# Patient Record
Sex: Female | Born: 1985 | Race: White | Hispanic: No | Marital: Married | Smoking: Never smoker
Health system: Southern US, Community
[De-identification: ages and names within clinical notes are randomized; demographics above are authoritative.]

## PROBLEM LIST (undated history)

## (undated) ENCOUNTER — Inpatient Hospital Stay (HOSPITAL_COMMUNITY): Payer: Self-pay

## (undated) DIAGNOSIS — T8859XA Other complications of anesthesia, initial encounter: Secondary | ICD-10-CM

## (undated) DIAGNOSIS — R87619 Unspecified abnormal cytological findings in specimens from cervix uteri: Secondary | ICD-10-CM

## (undated) DIAGNOSIS — T4145XA Adverse effect of unspecified anesthetic, initial encounter: Secondary | ICD-10-CM

## (undated) DIAGNOSIS — B009 Herpesviral infection, unspecified: Secondary | ICD-10-CM

## (undated) DIAGNOSIS — F430 Acute stress reaction: Secondary | ICD-10-CM

## (undated) DIAGNOSIS — J40 Bronchitis, not specified as acute or chronic: Secondary | ICD-10-CM

## (undated) DIAGNOSIS — R002 Palpitations: Secondary | ICD-10-CM

## (undated) DIAGNOSIS — K219 Gastro-esophageal reflux disease without esophagitis: Secondary | ICD-10-CM

## (undated) DIAGNOSIS — F319 Bipolar disorder, unspecified: Secondary | ICD-10-CM

## (undated) HISTORY — DX: Gastro-esophageal reflux disease without esophagitis: K21.9

## (undated) HISTORY — DX: Palpitations: R00.2

## (undated) HISTORY — PX: WISDOM TOOTH EXTRACTION: SHX21

## (undated) HISTORY — PX: KNEE SURGERY: SHX244

## (undated) HISTORY — DX: Unspecified abnormal cytological findings in specimens from cervix uteri: R87.619

## (undated) HISTORY — PX: TONSILLECTOMY: SUR1361

## (undated) HISTORY — DX: Acute stress reaction: F43.0

## (undated) HISTORY — DX: Herpesviral infection, unspecified: B00.9

---
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
## 2004-05-15 ENCOUNTER — Ambulatory Visit: Payer: Self-pay | Admitting: Family Medicine

## 2004-07-07 ENCOUNTER — Emergency Department: Payer: Self-pay | Admitting: Emergency Medicine

## 2004-07-14 ENCOUNTER — Ambulatory Visit: Payer: Self-pay | Admitting: Family Medicine

## 2005-02-19 ENCOUNTER — Ambulatory Visit: Payer: Self-pay | Admitting: Family Medicine

## 2005-04-28 ENCOUNTER — Encounter: Payer: Self-pay | Admitting: Family Medicine

## 2005-04-28 ENCOUNTER — Ambulatory Visit: Payer: Self-pay | Admitting: Family Medicine

## 2005-04-28 ENCOUNTER — Other Ambulatory Visit: Admission: RE | Admit: 2005-04-28 | Discharge: 2005-04-28 | Payer: Self-pay | Admitting: Family Medicine

## 2005-09-09 ENCOUNTER — Ambulatory Visit: Payer: Self-pay | Admitting: Family Medicine

## 2005-12-15 ENCOUNTER — Ambulatory Visit: Payer: Self-pay | Admitting: Family Medicine

## 2006-03-11 ENCOUNTER — Ambulatory Visit: Payer: Self-pay | Admitting: Family Medicine

## 2006-04-15 ENCOUNTER — Ambulatory Visit: Payer: Self-pay | Admitting: Family Medicine

## 2006-05-10 ENCOUNTER — Other Ambulatory Visit: Admission: RE | Admit: 2006-05-10 | Discharge: 2006-05-10 | Payer: Self-pay | Admitting: Family Medicine

## 2006-05-10 ENCOUNTER — Ambulatory Visit: Payer: Self-pay | Admitting: Family Medicine

## 2006-05-10 ENCOUNTER — Encounter: Payer: Self-pay | Admitting: Family Medicine

## 2006-11-22 ENCOUNTER — Telehealth (INDEPENDENT_AMBULATORY_CARE_PROVIDER_SITE_OTHER): Payer: Self-pay | Admitting: *Deleted

## 2006-12-13 ENCOUNTER — Encounter: Payer: Self-pay | Admitting: Family Medicine

## 2006-12-13 DIAGNOSIS — R040 Epistaxis: Secondary | ICD-10-CM | POA: Insufficient documentation

## 2006-12-13 DIAGNOSIS — R002 Palpitations: Secondary | ICD-10-CM | POA: Insufficient documentation

## 2006-12-14 ENCOUNTER — Ambulatory Visit: Payer: Self-pay | Admitting: Family Medicine

## 2006-12-14 DIAGNOSIS — K219 Gastro-esophageal reflux disease without esophagitis: Secondary | ICD-10-CM | POA: Insufficient documentation

## 2007-01-12 ENCOUNTER — Encounter (INDEPENDENT_AMBULATORY_CARE_PROVIDER_SITE_OTHER): Payer: Self-pay | Admitting: *Deleted

## 2007-06-09 ENCOUNTER — Ambulatory Visit: Payer: Self-pay | Admitting: Family Medicine

## 2007-07-12 ENCOUNTER — Encounter (INDEPENDENT_AMBULATORY_CARE_PROVIDER_SITE_OTHER): Payer: Self-pay | Admitting: Internal Medicine

## 2007-07-13 ENCOUNTER — Ambulatory Visit: Payer: Self-pay | Admitting: Family Medicine

## 2007-07-13 DIAGNOSIS — J069 Acute upper respiratory infection, unspecified: Secondary | ICD-10-CM | POA: Insufficient documentation

## 2007-09-23 ENCOUNTER — Encounter: Payer: Self-pay | Admitting: Family Medicine

## 2007-09-23 ENCOUNTER — Ambulatory Visit: Payer: Self-pay | Admitting: Family Medicine

## 2007-09-23 ENCOUNTER — Other Ambulatory Visit: Admission: RE | Admit: 2007-09-23 | Discharge: 2007-09-23 | Payer: Self-pay | Admitting: Family Medicine

## 2007-09-23 LAB — CONVERTED CEMR LAB

## 2007-10-06 DIAGNOSIS — Z8742 Personal history of other diseases of the female genital tract: Secondary | ICD-10-CM | POA: Insufficient documentation

## 2007-10-07 ENCOUNTER — Telehealth: Payer: Self-pay | Admitting: Family Medicine

## 2007-10-11 ENCOUNTER — Encounter: Payer: Self-pay | Admitting: Family Medicine

## 2007-11-29 ENCOUNTER — Ambulatory Visit: Payer: Self-pay | Admitting: Family Medicine

## 2007-11-29 DIAGNOSIS — M542 Cervicalgia: Secondary | ICD-10-CM | POA: Insufficient documentation

## 2007-11-29 DIAGNOSIS — R5383 Other fatigue: Secondary | ICD-10-CM

## 2007-11-29 DIAGNOSIS — R5381 Other malaise: Secondary | ICD-10-CM | POA: Insufficient documentation

## 2007-12-01 ENCOUNTER — Ambulatory Visit: Payer: Self-pay | Admitting: Family Medicine

## 2007-12-05 ENCOUNTER — Encounter: Payer: Self-pay | Admitting: Family Medicine

## 2007-12-05 ENCOUNTER — Telehealth (INDEPENDENT_AMBULATORY_CARE_PROVIDER_SITE_OTHER): Payer: Self-pay | Admitting: *Deleted

## 2008-05-25 ENCOUNTER — Telehealth (INDEPENDENT_AMBULATORY_CARE_PROVIDER_SITE_OTHER): Payer: Self-pay | Admitting: *Deleted

## 2008-06-21 ENCOUNTER — Ambulatory Visit: Payer: Self-pay | Admitting: Family Medicine

## 2008-06-21 DIAGNOSIS — M6580 Other synovitis and tenosynovitis, unspecified site: Secondary | ICD-10-CM | POA: Insufficient documentation

## 2008-07-03 ENCOUNTER — Ambulatory Visit: Payer: Self-pay | Admitting: Family Medicine

## 2008-07-12 ENCOUNTER — Telehealth (INDEPENDENT_AMBULATORY_CARE_PROVIDER_SITE_OTHER): Payer: Self-pay | Admitting: Internal Medicine

## 2008-07-17 ENCOUNTER — Telehealth (INDEPENDENT_AMBULATORY_CARE_PROVIDER_SITE_OTHER): Payer: Self-pay | Admitting: Internal Medicine

## 2008-12-12 ENCOUNTER — Ambulatory Visit: Payer: Self-pay | Admitting: Family Medicine

## 2008-12-14 ENCOUNTER — Ambulatory Visit: Payer: Self-pay | Admitting: Family Medicine

## 2008-12-21 ENCOUNTER — Ambulatory Visit: Payer: Self-pay | Admitting: Family Medicine

## 2008-12-21 ENCOUNTER — Other Ambulatory Visit: Admission: RE | Admit: 2008-12-21 | Discharge: 2008-12-21 | Payer: Self-pay | Admitting: Family Medicine

## 2008-12-21 ENCOUNTER — Encounter: Payer: Self-pay | Admitting: Family Medicine

## 2008-12-28 ENCOUNTER — Encounter (INDEPENDENT_AMBULATORY_CARE_PROVIDER_SITE_OTHER): Payer: Self-pay | Admitting: *Deleted

## 2009-02-05 ENCOUNTER — Ambulatory Visit: Payer: Self-pay | Admitting: Psychiatry

## 2009-02-06 ENCOUNTER — Encounter: Payer: Self-pay | Admitting: Family Medicine

## 2009-02-11 ENCOUNTER — Ambulatory Visit: Payer: Self-pay | Admitting: Psychiatry

## 2009-02-20 ENCOUNTER — Ambulatory Visit: Payer: Self-pay | Admitting: Psychiatry

## 2009-02-27 ENCOUNTER — Ambulatory Visit: Payer: Self-pay | Admitting: Psychiatry

## 2009-03-06 ENCOUNTER — Ambulatory Visit: Payer: Self-pay | Admitting: Psychiatry

## 2009-03-13 ENCOUNTER — Ambulatory Visit: Payer: Self-pay | Admitting: Psychiatry

## 2009-03-20 ENCOUNTER — Ambulatory Visit: Payer: Self-pay | Admitting: Psychiatry

## 2009-03-26 ENCOUNTER — Ambulatory Visit: Payer: Self-pay | Admitting: Psychiatry

## 2009-04-03 ENCOUNTER — Ambulatory Visit: Payer: Self-pay | Admitting: Psychiatry

## 2009-04-15 ENCOUNTER — Ambulatory Visit: Payer: Self-pay | Admitting: Family Medicine

## 2009-04-15 DIAGNOSIS — A6004 Herpesviral vulvovaginitis: Secondary | ICD-10-CM | POA: Insufficient documentation

## 2009-04-23 ENCOUNTER — Telehealth: Payer: Self-pay | Admitting: Family Medicine

## 2009-04-24 ENCOUNTER — Telehealth: Payer: Self-pay | Admitting: Family Medicine

## 2009-05-17 ENCOUNTER — Telehealth: Payer: Self-pay | Admitting: Family Medicine

## 2009-05-17 ENCOUNTER — Ambulatory Visit: Payer: Self-pay | Admitting: Internal Medicine

## 2009-05-20 ENCOUNTER — Telehealth: Payer: Self-pay | Admitting: Family Medicine

## 2009-05-23 ENCOUNTER — Telehealth: Payer: Self-pay | Admitting: Family Medicine

## 2009-06-11 ENCOUNTER — Ambulatory Visit: Payer: Self-pay | Admitting: Psychiatry

## 2009-06-19 ENCOUNTER — Ambulatory Visit: Payer: Self-pay | Admitting: Psychiatry

## 2009-06-26 ENCOUNTER — Ambulatory Visit: Payer: Self-pay | Admitting: Psychiatry

## 2009-07-01 ENCOUNTER — Telehealth: Payer: Self-pay | Admitting: Family Medicine

## 2009-07-03 ENCOUNTER — Ambulatory Visit: Payer: Self-pay | Admitting: Psychiatry

## 2009-07-10 ENCOUNTER — Ambulatory Visit: Payer: Self-pay | Admitting: Psychiatry

## 2009-07-24 ENCOUNTER — Ambulatory Visit: Payer: Self-pay | Admitting: Psychiatry

## 2009-08-07 ENCOUNTER — Ambulatory Visit: Payer: Self-pay | Admitting: Psychiatry

## 2009-08-15 ENCOUNTER — Ambulatory Visit: Payer: Self-pay | Admitting: Psychiatry

## 2009-08-21 ENCOUNTER — Ambulatory Visit: Payer: Self-pay | Admitting: Psychiatry

## 2009-09-04 ENCOUNTER — Ambulatory Visit: Payer: Self-pay | Admitting: Psychiatry

## 2009-09-24 ENCOUNTER — Ambulatory Visit: Payer: Self-pay | Admitting: Psychiatry

## 2009-10-15 ENCOUNTER — Ambulatory Visit: Payer: Self-pay | Admitting: Psychiatry

## 2009-10-22 ENCOUNTER — Ambulatory Visit: Payer: Self-pay | Admitting: Psychiatry

## 2009-11-12 ENCOUNTER — Telehealth: Payer: Self-pay | Admitting: Family Medicine

## 2009-11-13 ENCOUNTER — Ambulatory Visit: Payer: Self-pay | Admitting: Psychiatry

## 2009-11-15 ENCOUNTER — Telehealth: Payer: Self-pay | Admitting: Family Medicine

## 2009-12-11 ENCOUNTER — Ambulatory Visit: Payer: Self-pay | Admitting: Psychiatry

## 2009-12-25 ENCOUNTER — Telehealth (INDEPENDENT_AMBULATORY_CARE_PROVIDER_SITE_OTHER): Payer: Self-pay | Admitting: *Deleted

## 2009-12-25 ENCOUNTER — Ambulatory Visit: Payer: Self-pay | Admitting: Family Medicine

## 2009-12-25 LAB — CONVERTED CEMR LAB
ALT: 19 units/L (ref 0–35)
Alkaline Phosphatase: 79 units/L (ref 39–117)
Basophils Relative: 0.7 % (ref 0.0–3.0)
Bilirubin, Direct: 0.1 mg/dL (ref 0.0–0.3)
CO2: 29 meq/L (ref 19–32)
Eosinophils Relative: 1.7 % (ref 0.0–5.0)
GFR calc non Af Amer: 92.79 mL/min (ref >60)
Glucose, Bld: 86 mg/dL (ref 70–99)
HDL: 54.2 mg/dL (ref >39.00)
Lymphocytes Relative: 49.9 % — ABNORMAL HIGH (ref 12.0–46.0)
MCV: 94.8 fL (ref 78.0–100.0)
Monocytes Absolute: 0.3 10*3/uL (ref 0.1–1.0)
Monocytes Relative: 8.6 % (ref 3.0–12.0)
Neutrophils Relative %: 39.1 % — ABNORMAL LOW (ref 43.0–77.0)
Platelets: 167 10*3/uL (ref 150.0–400.0)
Potassium: 4.4 meq/L (ref 3.5–5.1)
RBC: 4.15 M/uL (ref 3.87–5.11)
Sodium: 138 meq/L (ref 135–145)
Total Bilirubin: 0.6 mg/dL (ref 0.3–1.2)
Total Protein: 6.5 g/dL (ref 6.0–8.3)
WBC: 3.4 10*3/uL — ABNORMAL LOW (ref 4.5–10.5)

## 2010-01-01 ENCOUNTER — Ambulatory Visit: Payer: Self-pay | Admitting: Family Medicine

## 2010-01-01 ENCOUNTER — Other Ambulatory Visit: Admission: RE | Admit: 2010-01-01 | Discharge: 2010-01-01 | Payer: Self-pay | Admitting: Family Medicine

## 2010-01-01 DIAGNOSIS — F319 Bipolar disorder, unspecified: Secondary | ICD-10-CM | POA: Insufficient documentation

## 2010-01-01 DIAGNOSIS — L708 Other acne: Secondary | ICD-10-CM | POA: Insufficient documentation

## 2010-01-01 DIAGNOSIS — D72819 Decreased white blood cell count, unspecified: Secondary | ICD-10-CM | POA: Insufficient documentation

## 2010-01-07 ENCOUNTER — Encounter: Payer: Self-pay | Admitting: Family Medicine

## 2010-01-07 LAB — CONVERTED CEMR LAB: Pap Smear: NEGATIVE

## 2010-02-13 ENCOUNTER — Ambulatory Visit: Payer: Self-pay | Admitting: Psychiatry

## 2010-03-05 ENCOUNTER — Telehealth: Payer: Self-pay | Admitting: Family Medicine

## 2010-03-11 ENCOUNTER — Telehealth: Payer: Self-pay | Admitting: Family Medicine

## 2010-03-12 ENCOUNTER — Telehealth: Payer: Self-pay | Admitting: Family Medicine

## 2010-04-02 ENCOUNTER — Ambulatory Visit: Payer: Self-pay | Admitting: Family Medicine

## 2010-04-02 ENCOUNTER — Telehealth: Payer: Self-pay | Admitting: Family Medicine

## 2010-04-07 LAB — CONVERTED CEMR LAB
Basophils Relative: 0.7 % (ref 0.0–3.0)
Eosinophils Absolute: 0 10*3/uL (ref 0.0–0.7)
HCT: 39.3 % (ref 36.0–46.0)
Hemoglobin: 13.8 g/dL (ref 12.0–15.0)
Lymphocytes Relative: 34.7 % (ref 12.0–46.0)
Lymphs Abs: 1.8 10*3/uL (ref 0.7–4.0)
MCHC: 35.1 g/dL (ref 30.0–36.0)
Monocytes Relative: 8.8 % (ref 3.0–12.0)
Neutro Abs: 2.9 10*3/uL (ref 1.4–7.7)
RBC: 4.01 M/uL (ref 3.87–5.11)
RDW: 12.6 % (ref 11.5–14.6)

## 2010-06-01 LAB — CONVERTED CEMR LAB
Basophils Absolute: 0 10*3/uL (ref 0.0–0.1)
Basophils Relative: 0.6 % (ref 0.0–3.0)
Calcium: 9.4 mg/dL (ref 8.4–10.5)
Chloride: 105 meq/L (ref 96–112)
Creatinine, Ser: 0.7 mg/dL (ref 0.4–1.2)
Eosinophils Absolute: 0 10*3/uL (ref 0.0–0.7)
GFR calc non Af Amer: 110 mL/min
MCHC: 34.5 g/dL (ref 30.0–36.0)
MCV: 93.1 fL (ref 78.0–100.0)
Neutrophils Relative %: 66 % (ref 43.0–77.0)
RBC: 4.28 M/uL (ref 3.87–5.11)
RDW: 10.9 % — ABNORMAL LOW (ref 11.5–14.6)
TSH: 1.17 microintl units/mL (ref 0.35–5.50)

## 2010-06-03 NOTE — Progress Notes (Signed)
Summary: refill request for valtrex  Phone Note Refill Request Call back at [phone] Message from:  Patient  Refills Requested: Medication #1:  VALTREX 500 MG TABS 1 by mouth two times a day for 5 days for outbreak. Phoned request from pt, please send to [facility].  Initial call taken by: Lowella Petties CMA,  July 01, 2009 9:38 AM  Follow-up for Phone Call        px written on EMR for call in  Follow-up by: Judith Part MD,  July 01, 2009 10:28 AM    Prescriptions: VALTREX 500 MG TABS (VALACYCLOVIR HCL) 1 by mouth two times a day for 5 days for outbreak  #10 x 3   Entered by:   Delilah Shan CMA (AAMA)   Authorized by:   Judith Part MD   Signed by:   Delilah Shan CMA (AAMA) on 07/01/2009   Method used:   Electronically to        [facility]* (retail)       [address]       [place], [place]  [address]       Ph: [phone]       Fax: [phone]   RxID:   [id number]

## 2010-06-03 NOTE — Progress Notes (Signed)
Summary: Pt wants rx for retinol cream  Phone Note Call from Patient Call back at [phone]   Caller: Patient Call For: Judith Part MD Summary of Call: Pt had labs today she mentioned she wanted to try the retinol cream for her face. [facility] Initial call taken by: Liane Comber CMA Duncan Dull),  April 02, 2010 8:43 AM  Follow-up for Phone Call        we have disc this for tx of acne follow directions carefully -- do not use if pregnant give it 3 mo to work  use sunscreen px written on EMR for call in  Follow-up by: Judith Part MD,  April 02, 2010 10:42 AM  Additional Follow-up for Phone Call Additional follow up Details #1::        Medication phoned to [facility] pharmacy as instructed. Left message for patient to call back. Lewanda Rife LPN  April 02, 2010 11:58 AM   Advised pt. Additional Follow-up by: Lowella Petties CMA, AAMA,  April 02, 2010 12:17 PM    New/Updated Medications: DIFFERIN 0.1 % CREA (ADAPALENE) apply to aff area as directed once daily for acne Prescriptions: DIFFERIN 0.1 % CREA (ADAPALENE) apply to aff area as directed once daily for acne  #1 medium x 3   Entered and Authorized by:   Judith Part MD   Signed by:   Lewanda Rife LPN on 11/91/4782   Method used:   Telephoned to ...         RxID:   [id number]

## 2010-06-03 NOTE — Assessment & Plan Note (Signed)
Summary: IRRITATED EYE   Vital Signs:  Patient profile:   25 year old female Weight:      125.38 pounds BMI:     21.27 Temp:     98.4 degrees F oral Pulse rate:   80 / minute Pulse rhythm:   regular BP sitting:   100 / 68  (left arm) Cuff size:   regular  Vitals Entered By: Linde Gillis CMA Duncan Dull) (May 17, 2009 5:06 PM) CC: eye pain   History of Present Illness: Left eye has been feeling funny feels like there is a hair in there irritated feeling Feels like eyelids are sticky  Noted some white discharge in the corners of her eye  No foreign body mild "tired" feeling in left eye  Had cold about 3 weeks ago No ongoing problems with this  Allergies: 1)  ! Naprosyn 2)  Duricef 3)  * Depo Provera 4)  * Ortho Tricyclen  Past History:  Past medical, surgical, family and social histories (including risk factors) reviewed for relevance to current acute and chronic problems.  Past Medical History: Reviewed history from 04/15/2009 and no changes required. abnormal pap GERD palpitations  stress reaction  HSV 2   Past Surgical History: Reviewed history from 09/23/2007 and no changes required. Tonsillectomy Knee surgery Holter monitor (2005) Pelvic US- neg (09/2005) wisdom teeth removed   Family History: Reviewed history from 09/23/2007 and no changes required. mother- healthy father- obese, HTN and chol   GF with prostate ca uncle with leukemia HTN on father's side GF with ETOH and MI some high chol   Social History: Reviewed history from 04/15/2009 and no changes required. grad college- psych and human development  non smoker occ alcohol - 2 times a week works at [employer] course finished school may-- prep for grad school--06/2008--now at [employer], ? Haematologist  monogamous boyfriend (has known hsv2)  Review of Systems       no new eye makeup No vision changes No contacts  Physical Exam  General:  alert.   NAD Eyes:  right eye normal  left eye with very mild inflammaiton of tarsal lower lid no foreign body slight injection   Impression & Recommendations:  Problem # 1:  CONJUNCTIVITIS (ICD-372.30) Assessment New  very mild will give rx for drops if worsens  Her updated medication list for this problem includes:    Sulfacetamide Sodium 10 % Soln (Sulfacetamide sodium) .Marland Kitchen... 2 drops in each eye three times a day for 5 days  Complete Medication List: 1)  Yaz 3-0.02 Mg Tabs (Drospirenone-ethinyl estradiol) .... Take by mouth as directed 2)  Multivitamins Tabs (Multiple vitamin) .... Take 1 tablet by mouth once a day 3)  Sulfacetamide Sodium 10 % Soln (Sulfacetamide sodium) .... 2 drops in each eye three times a day for 5 days  Patient Instructions: 1)  Please schedule a follow-up appointment as needed .  Prescriptions: SULFACETAMIDE SODIUM 10 % SOLN (SULFACETAMIDE SODIUM) 2 drops in each eye three times a day for 5 days  #1 bottle x 0   Entered and Authorized by:   Cindee Salt MD   Signed by:   Cindee Salt MD on 05/17/2009   Method used:   Print then Give to Patient   RxID:   [id number]   Current Allergies (reviewed today): ! NAPROSYN DURICEF * DEPO PROVERA * ORTHO TRICYCLEN

## 2010-06-03 NOTE — Progress Notes (Signed)
Summary: ? about Valtrex  Phone Note Call from Patient Call back at [phone]   Caller: Patient Call For: Judith Part MD Summary of Call: Patient called and said that she can't tell that Valtrex has helped a whole lot.  She has been a little nauseated but she was nauseated the last time she took Valtrex.  She doesn't think she is getting sick or catching the stomach bug that has been goin around, but would like a call back.   Initial call taken by: Linde Gillis CMA Duncan Dull),  May 23, 2009 4:01 PM  Follow-up for Phone Call        Spoke with pt, she says her symptoms are worse with valtrex this time.  She is having extreme nausea, but no vomiting.  Having some cramping and turning and a little bit of diarrhea.  No fever.  She thinks she needs a refill on the valtrex, but doesnt know if she should continue to take it.  [facility]  Pt knows that you are out of the office this afternoon. Follow-up by: Lowella Petties CMA,  May 23, 2009 4:11 PM  Additional Follow-up for Phone Call Additional follow up Details #1::        will problably need to substitute different antiviral for next outbreak- and I will note rxn this time  if she took course for this outbreak-- further med will not help  please call next time she has outbreak - or f/u if this one does not improve in a week Additional Follow-up by: Judith Part MD,  May 23, 2009 4:27 PM    Additional Follow-up for Phone Call Additional follow up Details #2::    Advised pt, she agreed.               Lowella Petties CMA  May 23, 2009 4:29 PM

## 2010-06-03 NOTE — Progress Notes (Signed)
Summary: refill request for valtrex 1 gm  Phone Note Refill Request Call back at [phone]   Refills Requested: Medication #1:  VALTREX 1 GM TABS 1 by mouth two times a day for 5 days Please call to [facility].  Initial call taken by: Lowella Petties CMA, AAMA,  March 05, 2010 8:42 AM  Follow-up for Phone Call        px written on EMR for call in  Follow-up by: Judith Part MD,  March 05, 2010 8:45 AM  Additional Follow-up for Phone Call Additional follow up Details #1::        Medication phoned to [facility] pharmacy as instructed. Patient notified as instructed by telephone. Lewanda Rife LPN  March 05, 2010 11:00 AM     New/Updated Medications: VALTREX 1 GM TABS (VALACYCLOVIR HCL) 1 by mouth two times a day for 5 days as needed Prescriptions: VALTREX 1 GM TABS (VALACYCLOVIR HCL) 1 by mouth two times a day for 5 days as needed  #10 x 5   Entered and Authorized by:   Judith Part MD   Signed by:   Lewanda Rife LPN on 45/40/9811   Method used:   Telephoned to ...         RxID:   [id number]

## 2010-06-03 NOTE — Progress Notes (Signed)
Summary: valtrex is not helping  Phone Note Call from Patient Call back at [phone]   Caller: Patient Summary of Call: Pt has been taking valtrex for herpes outbreak, she has a day and a half left. She says this is not helping her this time and she is asking if there is something else she can try.  [facility] Initial call taken by: Lowella Petties CMA,  November 15, 2009 10:09 AM  Follow-up for Phone Call        increase the valtrex to 1 gram two times a day for 5 days then stay on 500 mg dose every day to prevent outbreak  if not improved - please fu- or if fever or any other symptoms px written on EMR for call in  Follow-up by: Judith Part MD,  November 15, 2009 10:18 AM  Additional Follow-up for Phone Call Additional follow up Details #1::        Left message for patient to call back. Medication phoned to [facility] pharmacy as instructed. Lewanda Rife LPN  November 15, 2009 11:15 AM   Patient notified as instructed by telephone. Lewanda Rife LPN  November 15, 2009 2:46 PM     New/Updated Medications: VALTREX 500 MG TABS (VALACYCLOVIR HCL) 1 by mouth once daily (after finishing the 5 day higher dose course) VALTREX 1 GM TABS (VALACYCLOVIR HCL) 1 by mouth two times a day for 5 days Prescriptions: VALTREX 1 GM TABS (VALACYCLOVIR HCL) 1 by mouth two times a day for 5 days  #10 x 0   Entered and Authorized by:   Judith Part MD   Signed by:   Lewanda Rife LPN on 45/40/9811   Method used:   Telephoned to ...         RxID:   [id number] VALTREX 500 MG TABS (VALACYCLOVIR HCL) 1 by mouth once daily (after finishing the 5 day higher dose course)  #30 x 11   Entered and Authorized by:   Judith Part MD   Signed by:   Lewanda Rife LPN on 78/46/9629   Method used:   Telephoned to ...         RxID:   [id number]

## 2010-06-03 NOTE — Progress Notes (Signed)
Summary: yeast infection is not any better  Phone Note Call from Patient Call back at [phone]   Caller: Patient Call For: Judith Part MD Summary of Call: Pt was given diflucan yesterday for a yeast infection.  She says this is no better and is asking if she should just give it more time to work.  [facility] Initial call taken by: Lowella Petties CMA, AAMA,  March 12, 2010 10:31 AM  Follow-up for Phone Call        if it did not work-- then it could be another type of vaginitis that is not yeast- so need to check that out need appt please  Follow-up by: Judith Part MD,  March 12, 2010 11:25 AM  Additional Follow-up for Phone Call Additional follow up Details #1::        Left message for patient to call back. Lewanda Rife LPN  March 12, 2010 4:20 PM   Patient notified as instructed by telephone. Pt said she is feeling better this afternoon and would like to wait to see if she called to early. If pt has problem tomorrow she will call back for appt.Lewanda Rife LPN  March 12, 2010 4:30 PM

## 2010-06-03 NOTE — Assessment & Plan Note (Signed)
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  [employer] just started grad school    wt is stable   bp great 106/68  hx of abn pap and hsv in past last pap nl 8/10 is currently sexually active - using condoms    menses- no problems  she stopped taking yaz and is doing fine - peroids are still regular does not need birth control   doing well with lamictal  taking it for mood disorder - possibly early / very slt bipolar// dysthymia   is good with exercise and diet -- is doing well with that     Tdap 09 up to date   labs -- lipids good with HDL 54 and LDL 83  wbc is 3.4 does have hx of leukemia in family  a lot of acne off OC  has used neutrogena/ aaveno / stridex pads proactive did not work  oily complexion    Allergies: 1)  ! 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 Eyes:  Denies blurring and eye irritation. CV:  Denies chest pain or discomfort, lightheadness, and palpitations. Resp:  Denies cough, shortness of breath, and wheezing. GI:  Denies abdominal pain, change in bowel habits, indigestion, and nausea. GU:  Denies abnormal vaginal bleeding, discharge, dysuria, genital sores, and urinary frequency. MS:  Denies joint pain, joint redness, and joint swelling. Derm:  Denies itching, lesion(s), poor wound healing, and rash. Neuro:  Denies numbness, tingling, and weakness. Psych:  Denies panic attacks. Endo:  Denies cold intolerance, excessive thirst, excessive urination, and heat intolerance. Heme:  Denies abnormal bruising and bleeding.  Physical Exam  General:  Well-developed,well-nourished,in no acute distress; alert,appropriate and cooperative throughout examination Head:  normocephalic, atraumatic, and no abnormalities observed.   Eyes:  vision grossly intact, pupils equal, pupils round, and pupils reactive to light.  no conjunctival pallor, injection or icterus  Ears:  R ear normal and L ear normal.   Nose:  no nasal discharge.   Mouth:  pharynx pink and moist.   Neck:  supple with full rom and no masses or thyromegally, no JVD or carotid bruit  Chest Wall:  No deformities, masses, or tenderness noted. Breasts:  No mass, nodules, thickening, tenderness, bulging, retraction, inflamation, nipple discharge or skin changes noted.   Lungs:  Normal respiratory effort, chest expands symmetrically. Lungs are clear to auscultation, no crackles or wheezes. Heart:  Normal rate  and regular rhythm. S1 and S2 normal without gallop, murmur, click, rub or other extra sounds. Abdomen:  Bowel sounds positive,abdomen soft and non-tender without masses, organomegaly or hernias noted. Genitalia:  Normal introitus for age, no external lesions, no vaginal discharge, mucosa pink and moist, no vaginal or cervical lesions, no vaginal atrophy, no friaility or hemorrhage, normal uterus size and position, no adnexal masses or tenderness small amt of blood at os Msk:  No deformity or scoliosis noted of thoracic or lumbar spine.  no acute joint changes  Pulses:  R and L carotid,radial,femoral,dorsalis pedis and posterior tibial pulses are full and equal bilaterally Extremities:  No clubbing, cyanosis, edema, or deformity noted with normal full range of motion of all joints.   Neurologic:  sensation intact to light touch, gait normal, and DTRs symmetrical and normal.   Skin:  Intact without suspicious lesions or rashes notable comedogenic acne on face - forehead and cheeks generally oily complexion Cervical Nodes:  No lymphadenopathy noted Axillary Nodes:  No palpable lymphadenopathy Inguinal Nodes:  No significant adenopathy Psych:  normal affect, talkative and pleasant    Impression & Recommendations:  Problem # 1:  HEALTH MAINTENANCE EXAM (ICD-V70.0) Assessment Comment Only reviewed health habits including diet, exercise and skin cancer prevention reviewed health maintenance list and family history rev labs in detail  good health habits   Problem # 2:  ROUTINE GYNECOLOGICAL EXAMINATION (ICD-V72.31) Assessment: Comment Only annual exam with pap  disc use of condoms plus spermicide for preg prev (not candidate for OC due to lamictal)  disc STD prev as well  Problem # 3:  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) Assessment: New per pt mild with fam hx as well  sees psychiatry and doing well with lamictal  Problem # 4:  LEUKOPENIA, MILD (ICD-288.50) Assessment: New this is  mild and most likely assoc with lamictal  no sympotms remote fam hx of leukemia will follow- re check cbc with diff in 3 mo   Problem # 5:  ACNE VULGARIS, FACIAL (ICD-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 Apply to acne prone areas once daily two times a day as directed in thin film  Complete Medication List: 1)  Multivitamins Tabs (Multiple vitamin) .... Take 1 tablet by mouth once a day 2)  Valtrex 500 Mg Tabs (Valacyclovir hcl) .Marland Kitchen.. 1 by mouth once daily (after finishing the 5 day higher dose course) 3)  Valtrex 1 Gm Tabs (Valacyclovir hcl) .Marland Kitchen.. 1 by mouth two times a day for 5 days 4)  Ginkoba 40 Mg Tabs (Ginkgo biloba) .... Take 1 tablet by mouth  once a day 5)  Lamictal 150 Mg Tabs (Lamotrigine) .... Take 1 tablet by mouth once a day 6)  Benzaclin 1-5 % Gel (Clindamycin phos-benzoyl perox) .... Apply to acne prone areas once daily two times a day as directed in thin film  Patient Instructions: 1)  schedule non fasting labs in 3 months for cbc with diff for leukopenia  2)  keep up the good diet and exercise 3)  try the benzaclin gel for acne as directed  4)  use gentle cleanser two times a day  Prescriptions: BENZACLIN 1-5 % GEL (CLINDAMYCIN PHOS-BENZOYL PEROX) apply to acne prone areas once daily two times a day as directed in thin film  #1 large x 3   Entered and Authorized by:   Judith Part MD   Signed by:   Judith Part MD on 01/01/2010   Method used:   Electronically to        [facility]* (retail)       [address]       [place], [place]  [address]       Ph: [phone]       Fax: [phone]   RxID:   [id number]   Current  Allergies (reviewed today): ! NAPROSYN DURICEF * DEPO PROVERA * ORTHO TRICYCLEN

## 2010-06-03 NOTE — Progress Notes (Signed)
----   Converted from flag ---- ---- 12/24/2009 8:08 PM, Judith Part MD wrote: please check wellness and lipids v70.0-thanks   ---- 12/24/2009 8:27 AM, Liane Comber CMA (AAMA) wrote: Peri [place] Morning! This pt is scheduled for cpx labs tomorrow, which labs to draw and dx codes to use? Thanks Tasha ------------------------------

## 2010-06-03 NOTE — Progress Notes (Signed)
Summary: lt eye irritated, itching and has white drainage  Phone Note Call from Patient Call back at [phone]   Caller: Patient Call For: Judith Part MD Summary of Call: For a couple of days pt's left eye has felt irritated, itching and has white drainage from lt eye. No redness in left eye. Pt said would come in and see Dr. Milinda Antis if necessary or can call med to [facility] [phone]. Please advise.  Pt can be reached at [phone]. Initial call taken by: Lewanda Rife LPN,  May 17, 2009 11:10 AM  Follow-up for Phone Call        cannot tx her without visit  if no appts avail- sat clinic please  Follow-up by: Judith Part MD,  May 17, 2009 11:35 AM  Additional Follow-up for Phone Call Additional follow up Details #1::        Appt made. Additional Follow-up by: Lowella Petties CMA,  May 17, 2009 1:25 PM

## 2010-06-03 NOTE — Letter (Signed)
Summary: Results Follow up Letter  [place] [facility]  [address] [place], [place] [address]   Phone: [phone]  Fax: [phone]    01/07/2010 MRN: [id number]    [facility] [address] [place], [place]  [address]    Dear Ms. Alonza Smoker,  The following are the results of your recent test(s):  Test         Result    Pap Smear:        Normal ___X__  Not Normal _____ Comments: ______________________________________________________ Cholesterol: LDL(Bad cholesterol):         Your goal is less than:         HDL (Good cholesterol):       Your goal is more than: Comments:  ______________________________________________________ Mammogram:        Normal _____  Not Normal _____ Comments:  ___________________________________________________________________ Hemoccult:        Normal _____  Not normal _______ Comments:    _____________________________________________________________________ Other Tests:    We routinely do not discuss normal results over the telephone.  If you desire a copy of the results, or you have any questions about this information we can discuss them at your next office visit.   Sincerely,    Idamae Schuller Tower,MD  MT/ri

## 2010-06-03 NOTE — Progress Notes (Signed)
Summary: requests diflucan  Phone Note Call from Patient Call back at [phone]   Caller: Patient Call For: Judith Part MD Summary of Call: Pt is asking for diflucan, states she has a yeast infection, which she often gets after outbreaks.  [facility] Initial call taken by: Lowella Petties CMA, AAMA,  March 11, 2010 10:05 AM  Follow-up for Phone Call        px written on EMR for call in f/u if not improved  Follow-up by: Judith Part MD,  March 11, 2010 10:59 AM  Additional Follow-up for Phone Call Additional follow up Details #1::        Rx sent to pharmacy. Patient notified by telephone. Additional Follow-up by: Sydell Axon LPN,  March 11, 2010 1:36 PM    New/Updated Medications: DIFLUCAN 150 MG TABS (FLUCONAZOLE) 1 by mouth times one for yeast infx Prescriptions: DIFLUCAN 150 MG TABS (FLUCONAZOLE) 1 by mouth times one for yeast infx  #1 x 0   Entered by:   Sydell Axon LPN   Authorized by:   Judith Part MD   Signed by:   Sydell Axon LPN on 82/95/6213   Method used:   Electronically to        [facility]* (retail)       [address]       [place], [place]  [address]       Ph: [phone]       Fax: [phone]   RxID:   [id number]

## 2010-06-03 NOTE — Progress Notes (Signed)
Summary: Valtrex refill  Phone Note Call from Patient Call back at [phone]   Caller: Patient Call For: Judith Part MD Summary of Call: Pt needs refill on Valtrex. Pt said new outbreak on perineal area this weekend. Pt uses [facility] [phone].Please advise.  Initial call taken by: Lewanda Rife LPN,  May 20, 2009 10:02 AM  Follow-up for Phone Call        px written on EMR for call in treatment for recurrent episodes is 500 mg two times a day for 5 days let me know if not improving   Follow-up by: Judith Part MD,  May 20, 2009 10:41 AM  Additional Follow-up for Phone Call Additional follow up Details #1::        Called to [facility], advised pt. Additional Follow-up by: Lowella Petties CMA,  May 20, 2009 10:46 AM    New/Updated Medications: VALTREX 500 MG TABS (VALACYCLOVIR HCL) 1 by mouth two times a day for 5 days for outbreak Prescriptions: VALTREX 500 MG TABS (VALACYCLOVIR HCL) 1 by mouth two times a day for 5 days for outbreak  #10 x 3   Entered and Authorized by:   Judith Part MD   Signed by:   Judith Part MD on 05/20/2009   Method used:   Telephoned to ...         RxID:   [id number]

## 2010-06-03 NOTE — Progress Notes (Signed)
Summary: valtrex  Phone Note Refill Request Call back at [phone] Message from:  Patient on November 12, 2009 11:53 AM  Refills Requested: Medication #1:  VALTREX 500 MG TABS 1 by mouth two times a day for 5 days for outbreak. [facility]  Initial call taken by: Melody Comas,  November 12, 2009 11:53 AM  Follow-up for Phone Call        px written on EMR for call in  Follow-up by: Judith Part MD,  November 12, 2009 12:06 PM  Additional Follow-up for Phone Call Additional follow up Details #1::        Patient notified as instructed by telephone. Medication phoned to [facility] pharmacy as instructed. Lewanda Rife LPN  November 12, 2009 12:45 PM     New/Updated Medications: VALTREX 500 MG TABS (VALACYCLOVIR HCL) 1 by mouth two times a day for 5 days for outbreak Prescriptions: VALTREX 500 MG TABS (VALACYCLOVIR HCL) 1 by mouth two times a day for 5 days for outbreak  #10 x 3   Entered and Authorized by:   Judith Part MD   Signed by:   Lewanda Rife LPN on 11/91/4782   Method used:   Telephoned to ...         RxID:   [id number]

## 2010-11-12 ENCOUNTER — Other Ambulatory Visit: Payer: Self-pay | Admitting: *Deleted

## 2010-11-12 MED ORDER — VALACYCLOVIR HCL 500 MG PO TABS: ORAL_TABLET | ORAL | Status: DC

## 2010-11-12 NOTE — Telephone Encounter (Signed)
Sent electronically 

## 2011-01-07 ENCOUNTER — Encounter: Payer: Self-pay | Admitting: Family Medicine

## 2011-01-07 ENCOUNTER — Ambulatory Visit (INDEPENDENT_AMBULATORY_CARE_PROVIDER_SITE_OTHER): Payer: BC Managed Care – PPO | Admitting: Family Medicine

## 2011-01-07 DIAGNOSIS — Z113 Encounter for screening for infections with a predominantly sexual mode of transmission: Secondary | ICD-10-CM | POA: Insufficient documentation

## 2011-01-07 DIAGNOSIS — L708 Other acne: Secondary | ICD-10-CM

## 2011-01-07 DIAGNOSIS — Z0000 Encounter for general adult medical examination without abnormal findings: Secondary | ICD-10-CM

## 2011-01-07 DIAGNOSIS — A6004 Herpesviral vulvovaginitis: Secondary | ICD-10-CM

## 2011-01-07 LAB — COMPREHENSIVE METABOLIC PANEL
CO2: 29 mEq/L (ref 19–32)
Calcium: 9.6 mg/dL (ref 8.4–10.5)
Chloride: 103 mEq/L (ref 96–112)
Creatinine, Ser: 0.8 mg/dL (ref 0.4–1.2)
GFR: 94.77 mL/min (ref >60.00)
Glucose, Bld: 71 mg/dL (ref 70–99)
Total Bilirubin: 0.5 mg/dL (ref 0.3–1.2)

## 2011-01-07 LAB — CBC WITH DIFFERENTIAL/PLATELET
Basophils Relative: 0.5 % (ref 0.0–3.0)
Eosinophils Relative: 0.4 % (ref 0.0–5.0)
HCT: 40 % (ref 36.0–46.0)
Hemoglobin: 13.6 g/dL (ref 12.0–15.0)
Lymphs Abs: 1.4 10*3/uL (ref 0.7–4.0)
Monocytes Relative: 7.7 % (ref 3.0–12.0)
Neutro Abs: 2.8 10*3/uL (ref 1.4–7.7)
RBC: 4.09 Mil/uL (ref 3.87–5.11)
WBC: 4.6 10*3/uL (ref 4.5–10.5)

## 2011-01-07 LAB — HM PAP SMEAR

## 2011-01-07 LAB — TSH: TSH: 0.29 u[IU]/mL — ABNORMAL LOW (ref 0.35–5.50)

## 2011-01-07 LAB — LIPID PANEL
HDL: 71 mg/dL (ref >39.00)
VLDL: 12.4 mg/dL (ref 0.0–40.0)

## 2011-01-07 MED ORDER — DROSPIRENONE-ETHINYL ESTRADIOL 3-0.02 MG PO TABS: 1.0000 | ORAL_TABLET | Freq: Every day | ORAL | Status: DC

## 2011-01-07 NOTE — Assessment & Plan Note (Signed)
Doing well with suppressive therapy  Will continue this  Checking other std tests today F/u for gyn exam

## 2011-01-07 NOTE — Progress Notes (Signed)
Subjective:    Patient ID: Kayla Chavez, female    DOB: 1985-12-09, 25 y.o.   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 HERPES VULVOVAGINITIS  . LEUKOPENIA, MILD  . BIPOLAR AFFECTIVE DISORDER  . G E R D  . ACNE VULGARIS, FACIAL  . FATIGUE  . EPISTAXIS  . ASCUS PAP  . Routine general medical examination at a health care facility  . Screen for STD (sexually transmitted disease)   Past Medical History  Diagnosis Date  . Abnormal Pap smear of cervix   . GERD (gastroesophageal reflux disease)   . Palpitations   . Stress reaction   . HSV-2 infection    Past Surgical History  Procedure Date  . Tonsillectomy   . Knee surgery   . Wisdom tooth extraction    History  Substance Use Topics  . Smoking status: Never Smoker   . Smokeless tobacco: Not on file  . Alcohol Use: Yes   Family History  Problem Relation Age of Onset  . Hypertension Father   . Hyperlipidemia Father   . Leukemia Paternal Uncle   . Cancer Paternal Grandfather     prostate  . Alcohol abuse Paternal Grandfather   . Hyperlipidemia Paternal Grandfather    Allergies  Allergen Reactions  . Cefadroxil     REACTION: rash  . Depo-Provera     GI   . Naproxen    REACTION: diarrhea  . Ortho Tri-Cyclen (Norgestim-Eth Estrad Triphasic)     Heavy menses   Current Outpatient Prescriptions on File Prior to Visit  Medication Sig Dispense Refill  . valACYclovir (VALTREX) 500 MG tablet Take one by mouth once daily  30 tablet  11      Review of Systems Review of Systems  Constitutional: Negative for fever, appetite change, fatigue and unexpected weight change.  Eyes: Negative for pain and visual disturbance.  Respiratory: Negative for cough and shortness of breath.   Cardiovascular: Negative for cp or palpitations    Gastrointestinal: Negative for nausea, diarrhea and constipation.  Genitourinary: Negative for urgency and frequency.  Skin: Negative for pallor or rash  pos for acne Neurological: Negative for weakness, light-headedness, numbness and headaches.  Hematological: Negative for adenopathy. Does not bruise/bleed easily.  Psychiatric/Behavioral: Negative for dysphoric mood. The patient is not nervous/anxious.          Objective:   Physical Exam  Constitutional: She appears well-developed and well-nourished. No distress.  HENT:  Head: Normocephalic and atraumatic.  Right Ear: External ear normal.  Left Ear: External ear normal.  Nose: Nose  normal.  Mouth/Throat: Oropharynx is clear and moist.  Eyes: Conjunctivae and EOM are normal. Pupils are equal, round, and reactive to light.  Neck: Normal range of motion. Neck supple. No JVD present. Carotid bruit is not present. No thyromegaly present.  Cardiovascular: Normal rate, regular rhythm, normal heart sounds and intact distal pulses.   Pulmonary/Chest: Effort normal and breath sounds normal. No respiratory distress. She has no wheezes.  Abdominal: Soft. Bowel sounds are normal. She exhibits no distension and no mass. There is no tenderness.  Musculoskeletal: Normal range of motion. She exhibits no edema and no tenderness.  Lymphadenopathy:    She has no cervical adenopathy.  Neurological:  She is alert. She has normal reflexes. No cranial nerve deficit. Coordination normal.  Skin: Skin is warm and dry. No rash noted. No erythema. No pallor.       comedonal acne over chin and jaw line  No pustules   Psychiatric: She has a normal mood and affect.          Assessment & Plan:

## 2011-01-07 NOTE — Patient Instructions (Addendum)
Please schedule visit for gyn exam -any 15 min visit is fine Labs today  Will discuss at your follow up  Will start back on yaz for acne- and update me if any problems

## 2011-01-07 NOTE — Assessment & Plan Note (Signed)
Pt requested Does use condoms  Will check hiv and also rpr with blood test Then will check gon/ chlam with pap when she returns No sympotms at all

## 2011-01-07 NOTE — Assessment & Plan Note (Signed)
Pt failed benzaclin and differin is not working well  Will go back on yaz which is working well - disc pros/ cons If this works very well she will likely go off the differin Will disc further at f/u

## 2011-01-07 NOTE — Assessment & Plan Note (Signed)
Reviewed health habits including diet and exercise and skin cancer prevention Also reviewed health mt list, fam hx and immunizations  Wellness labs today Return for gyn exam when menses is over

## 2011-01-08 LAB — RPR

## 2011-01-22 ENCOUNTER — Encounter: Payer: Self-pay | Admitting: Family Medicine

## 2011-01-23 ENCOUNTER — Ambulatory Visit (INDEPENDENT_AMBULATORY_CARE_PROVIDER_SITE_OTHER): Payer: BC Managed Care – PPO | Admitting: Family Medicine

## 2011-01-23 ENCOUNTER — Other Ambulatory Visit (HOSPITAL_COMMUNITY)
Admission: RE | Admit: 2011-01-23 | Discharge: 2011-01-23 | Disposition: A | Discharge status: Home / Self Care | Payer: BC Managed Care – PPO | Source: Ambulatory Visit | Attending: Family Medicine | Admitting: Family Medicine

## 2011-01-23 ENCOUNTER — Encounter: Payer: Self-pay | Admitting: Family Medicine

## 2011-01-23 VITALS — BP 100/66 | HR 84 | Temp 98.8°F | Ht 64.5 in | Wt 129.5 lb

## 2011-01-23 DIAGNOSIS — Z23 Encounter for immunization: Secondary | ICD-10-CM

## 2011-01-23 DIAGNOSIS — Z01419 Encounter for gynecological examination (general) (routine) without abnormal findings: Secondary | ICD-10-CM | POA: Insufficient documentation

## 2011-01-23 DIAGNOSIS — Z113 Encounter for screening for infections with a predominantly sexual mode of transmission: Secondary | ICD-10-CM

## 2011-01-23 DIAGNOSIS — R7989 Other specified abnormal findings of blood chemistry: Secondary | ICD-10-CM | POA: Insufficient documentation

## 2011-01-23 DIAGNOSIS — R6889 Other general symptoms and signs: Secondary | ICD-10-CM

## 2011-01-23 NOTE — Progress Notes (Signed)
Subjective:    Patient ID: Kayla Chavez, female    DOB: 1985-07-24, 25 y.o.   MRN: [id number]  HPI Here for gyn exam annual   Has hsv on suppressive tx  Doing well with that   On yaz Periods are very regular and , last 4-5 days , not painful -- vary in heaviness Wants to stay on this  Does not smoke  occ pelvic pain before period - ? Cramps   Wanted to do gon and chlam test today STD blood tests were all neg   tsh low - mildly  Pt is a little on the shaky side Has lost wt - but eating differently too  No goiter   Patient Active Problem List  Diagnoses  . HERPES VULVOVAGINITIS  . LEUKOPENIA, MILD  . BIPOLAR AFFECTIVE DISORDER  . G E R D  . ACNE VULGARIS, FACIAL  . FATIGUE  . EPISTAXIS  . ASCUS PAP  . Routine general medical examination at a health care facility  . Screen for STD (sexually transmitted disease)  . Gynecological examination  . Abnormal TSH   Past Medical History  Diagnosis Date  . Abnormal Pap smear of cervix   . GERD (gastroesophageal reflux disease)   . Palpitations   . Stress reaction   . HSV-2 infection    Past Surgical History  Procedure Date  . Tonsillectomy   . Knee surgery   . Wisdom tooth extraction    History  Substance Use Topics  . Smoking status: Never Smoker   . Smokeless tobacco: Not on file  . Alcohol Use: Yes   Family History  Problem Relation Age of Onset  . Hypertension Father   . Hyperlipidemia Father   . Leukemia Paternal Uncle   . Cancer Paternal Grandfather     prostate  . Alcohol abuse Paternal Grandfather   . Hyperlipidemia Paternal Grandfather    Allergies  Allergen Reactions  . Cefadroxil     REACTION: rash  . Depo-Provera     GI   . Naproxen     REACTION: diarrhea  . Ortho Tri-Cyclen (Norgestim-Eth Estrad Triphasic)     Heavy menses   Current Outpatient Prescriptions on File Prior to Visit  Medication Sig Dispense Refill  . adapalene (DIFFERIN) 0.1 % cream Apply to affected area as directed  once daily for acne       . drospirenone-ethinyl estradiol (YAZ) 3-0.02 MG tablet Take 1 tablet by mouth daily.  3 Package  3  . lamoTRIgine (LAMICTAL) 150 MG tablet Take 150 mg by mouth daily.        . Multiple Vitamin (MULTIVITAMIN) tablet Take 1 tablet by mouth daily.        . valACYclovir (VALTREX) 500 MG tablet Take one by mouth once daily  30 tablet  11  . Ginkgo Biloba (GINKOBA) 40 MG TABS Take 1 tablet by mouth daily.        . valACYclovir (VALTREX) 1000 MG tablet Take 1,000 mg by mouth 2 (two) times daily. For 5 days as needed.             Review of Systems Review of Systems  Constitutional: Negative for fever, appetite change, fatigue and unexpected weight change.  Eyes: Negative for pain and visual disturbance.  Respiratory: Negative for cough and shortness of breath.   Cardiovascular: Negative for cp or palpitations    Gastrointestinal: Negative for nausea, diarrhea and constipation.  Genitourinary: Negative for urgency and frequency.  Skin: Negative for pallor  or rash   Neurological: Negative for weakness, light-headedness, numbness and headaches. pos for jitteriness Hematological: Negative for adenopathy. Does not bruise/bleed easily.  Psychiatric/Behavioral: Negative for dysphoric mood. The patient is not nervous/anxious.          Objective:   Physical Exam  Constitutional: She appears well-developed and well-nourished. No distress.  HENT:  Head: Normocephalic and atraumatic.  Mouth/Throat: Oropharynx is clear and moist.  Eyes: Conjunctivae and EOM are normal. Pupils are equal, round, and reactive to light.  Neck: Normal range of motion. Neck supple. No JVD present. No thyromegaly present.       No thyroid goiter  Cardiovascular: Normal rate, regular rhythm, normal heart sounds and intact distal pulses.   Pulmonary/Chest: Effort normal and breath sounds normal. No respiratory distress. She has no wheezes.  Abdominal: Soft. Bowel sounds are normal.  Genitourinary:  Vagina normal and uterus normal. No breast swelling, tenderness, discharge or bleeding. No vaginal discharge found.  Musculoskeletal: She exhibits no edema and no tenderness.  Lymphadenopathy:    She has no cervical adenopathy.  Neurological: She is alert. She has normal reflexes. No cranial nerve deficit. Coordination normal.       Very slight hand tremor   Skin: Skin is warm and dry. No rash noted. No erythema. No pallor.  Psychiatric: She has a normal mood and affect.          Assessment & Plan:

## 2011-01-23 NOTE — Assessment & Plan Note (Signed)
Nl exam Continues yaz- doing well Std screen Rev prev labs

## 2011-01-23 NOTE — Patient Instructions (Signed)
Pap today- will update you with results  Doing thyroid tests today and will update you

## 2011-01-23 NOTE — Assessment & Plan Note (Signed)
Blood tests neg Gon and chlam with pap hsv is stable

## 2011-01-23 NOTE — Assessment & Plan Note (Signed)
Some jitteriness and intentional wt loss Check thyroid prof and update Nl exam

## 2011-01-24 LAB — T3 UPTAKE: T3 Uptake: 24.4 % (ref 22.5–37.0)

## 2011-01-29 ENCOUNTER — Other Ambulatory Visit: Payer: Self-pay

## 2011-01-30 MED ORDER — FLUCONAZOLE 150 MG PO TABS: 150.0000 mg | ORAL_TABLET | Freq: Once | ORAL | Status: DC

## 2011-01-30 NOTE — Telephone Encounter (Signed)
Medication Diflucan 150 mg phoned to [facility] pharmacy as instructed.

## 2011-07-10 ENCOUNTER — Ambulatory Visit (INDEPENDENT_AMBULATORY_CARE_PROVIDER_SITE_OTHER): Payer: BC Managed Care – PPO | Admitting: Family Medicine

## 2011-07-10 ENCOUNTER — Encounter: Payer: Self-pay | Admitting: Family Medicine

## 2011-07-10 VITALS — BP 110/70 | HR 78 | Temp 98.0°F | Wt 131.8 lb

## 2011-07-10 DIAGNOSIS — R109 Unspecified abdominal pain: Secondary | ICD-10-CM

## 2011-07-10 LAB — POCT URINALYSIS DIPSTICK
Ketones, UA: NEGATIVE
Protein, UA: NEGATIVE
Spec Grav, UA: 1.01
pH, UA: 7.5

## 2011-07-10 NOTE — Patient Instructions (Signed)
Use a heating pad and take up to 2 aleve up to twice a day with food.  This should get better. If you have abnormal bleeding, discharge or more pain, then let Dr. Milinda Antis know.  Take care.

## 2011-07-10 NOTE — Progress Notes (Signed)
During and then after intercourse, she has throbbing on L side.  Not before intercourse.  Can last up to 1 hour afterwards.  Started this week, with every episode this week.  Never before.  No FCNAVD.  No blood in stool.  No dysuria.  No pelvic or groin pain.  No pain on R side. No upper back pain.  She does have some lower back pain.  Rapid onset and offset of throbbing.  No abd surgery other than cryosurgery for cervical lesion.  She stopped taking yaz a few months ago.  She is getting ready to start a cycle in about 10 days.  LMP was 06/24/11.  Intercourse with protection.  No vaginal pain, no discharge, no dysuria.   Meds, vitals, and allergies reviewed.   ROS: See HPI.  Otherwise, noncontributory.  nad ncat Mmm rrr ctab abd soft, not ttp except minimally in LLQ, no rebound, normal BS Back: L lower paraspinal muscle tenderness No cva pain  U/a was neg.

## 2011-07-12 DIAGNOSIS — R109 Unspecified abdominal pain: Secondary | ICD-10-CM | POA: Insufficient documentation

## 2011-07-12 NOTE — Assessment & Plan Note (Signed)
Benign exam w/o sig pain, no rebound, episodic sx.  ddx d/w pt, I would treat for muscle pain with heat and nsaids.  F/u with PCP prn.  She could have an ovarian cyst, but tx would be nsaids.  D/w pt.  She understood.  GI caution on nsaids.

## 2011-07-13 ENCOUNTER — Encounter: Payer: Self-pay | Admitting: *Deleted

## 2011-07-13 ENCOUNTER — Telehealth: Payer: Self-pay | Admitting: Family Medicine

## 2011-07-13 NOTE — Telephone Encounter (Signed)
Letter written

## 2011-07-13 NOTE — Telephone Encounter (Signed)
Patient was seen on Friday and she needs a note for work.  She left work early on Friday.

## 2011-07-30 ENCOUNTER — Encounter: Payer: Self-pay | Admitting: Family Medicine

## 2011-07-30 ENCOUNTER — Ambulatory Visit (INDEPENDENT_AMBULATORY_CARE_PROVIDER_SITE_OTHER): Payer: BC Managed Care – PPO | Admitting: Family Medicine

## 2011-07-30 VITALS — BP 102/70 | HR 96 | Temp 98.3°F | Wt 135.8 lb

## 2011-07-30 DIAGNOSIS — J069 Acute upper respiratory infection, unspecified: Secondary | ICD-10-CM

## 2011-07-30 MED ORDER — AZITHROMYCIN 250 MG PO TABS: ORAL_TABLET | ORAL | Status: AC

## 2011-07-30 NOTE — Progress Notes (Signed)
duration of symptoms:1 week Rhinorrhea: yes congestion:yes ear pain: yes sore throat:yes Cough: no Myalgias: yes other concerns: possibly with fever last night.   Taking mucinex.   ROS: See HPI.  Otherwise negative.    Meds, vitals, and allergies reviewed.   GEN: nad, alert and oriented HEENT: mucous membranes moist, TM w/o erythema, nasal epithelium injected, OP with cobblestoning NECK: supple w/o LA CV: rrr. PULM: ctab, no inc wob ABD: soft, +bs EXT: no edema

## 2011-07-30 NOTE — Patient Instructions (Signed)
Drink plenty of fluids, take tylenol as needed, and gargle with warm salt water for your throat.  This should gradually improve.  Take care.  Let us know if you have other concerns.   Start the antibiotics in a few days if not better.  

## 2011-08-02 DIAGNOSIS — J069 Acute upper respiratory infection, unspecified: Secondary | ICD-10-CM | POA: Insufficient documentation

## 2011-08-02 NOTE — Assessment & Plan Note (Signed)
Likely viral, lungs clear, hold abx for now. She could have a sinusitis, but with a benign exam I would give this longer to selfresolve. If not improved in a few days, then start the abx.  ddx d/w pt, she understood.  Nontoxic.

## 2011-09-30 ENCOUNTER — Encounter: Payer: Self-pay | Admitting: *Deleted

## 2011-09-30 ENCOUNTER — Encounter: Payer: Self-pay | Admitting: Family Medicine

## 2011-09-30 ENCOUNTER — Ambulatory Visit (INDEPENDENT_AMBULATORY_CARE_PROVIDER_SITE_OTHER): Payer: BC Managed Care – PPO | Admitting: Family Medicine

## 2011-09-30 VITALS — BP 102/72 | HR 74 | Temp 98.3°F | Ht 64.5 in | Wt 133.8 lb

## 2011-09-30 DIAGNOSIS — K1370 Unspecified lesions of oral mucosa: Secondary | ICD-10-CM

## 2011-09-30 DIAGNOSIS — K130 Diseases of lips: Secondary | ICD-10-CM

## 2011-09-30 NOTE — Patient Instructions (Signed)
We will refer you for ENT evaluation at check out  I think you have a mucocele on lip

## 2011-09-30 NOTE — Assessment & Plan Note (Signed)
Inside of lower lip - persistent for 2 weeks - most likely recurrent trauma Very bothersome  Will ref to ENT for further eval and tx  Adv not to try to express it or bite on it

## 2011-09-30 NOTE — Progress Notes (Signed)
Subjective:    Patient ID: Kayla Chavez, female    DOB: 29-Jan-1986, 26 y.o.   MRN: [id number]  HPI Here for area inside her lip (hx of swollen saliva ducts in past)   Very swollen and irritated - hurts for 2 weeks  No cold or uri symptoms No fever  Some stress   L side of bottom lip   Throat feels ok   Patient Active Problem List  Diagnoses  . HERPES VULVOVAGINITIS  . LEUKOPENIA, MILD  . BIPOLAR AFFECTIVE DISORDER  . G E R D  . ACNE VULGARIS, FACIAL  . FATIGUE  . EPISTAXIS  . ASCUS PAP  . Routine general medical examination at a health care facility  . Screen for STD (sexually transmitted disease)  . Gynecological examination  . Abnormal TSH  . Abdominal  pain, other specified site  . URI (upper respiratory infection)  . Mucocele of lower lip   Past Medical History  Diagnosis Date  . Abnormal Pap smear of cervix   . GERD (gastroesophageal reflux disease)   . Palpitations   . Stress reaction   . HSV-2 infection    Past Surgical History  Procedure Date  . Tonsillectomy   . Knee surgery   . Wisdom tooth extraction    History  Substance Use Topics  . Smoking status: Never Smoker   . Smokeless tobacco: Not on file  . Alcohol Use: Yes   Family History  Problem Relation Age of Onset  . Hypertension Father   . Hyperlipidemia Father   . Leukemia Paternal Uncle   . Cancer Paternal Grandfather     prostate  . Alcohol abuse Paternal Grandfather   . Hyperlipidemia Paternal Grandfather    Allergies  Allergen Reactions  . Cefadroxil     REACTION: rash  . Medroxyprogesterone Acetate     GI   . Naproxen     REACTION: diarrhea  . Ortho Tri-Cyclen (Norgestimate-Eth Estradiol)     Heavy menses   Current Outpatient Prescriptions on File Prior to Visit  Medication Sig Dispense Refill  . lamoTRIgine (LAMICTAL) 150 MG tablet Take 150 mg by mouth daily.        . Multiple Vitamin (MULTIVITAMIN) tablet Take 1 tablet by mouth daily.        . valACYclovir  (VALTREX) 500 MG tablet Take one by mouth once daily  30 tablet  11  . DISCONTD: drospirenone-ethinyl estradiol (YAZ) 3-0.02 MG tablet Take 1 tablet by mouth daily.  3 Package  3      Review of Systems Review of Systems  Constitutional: Negative for fever, appetite change, fatigue and unexpected weight change.  Eyes: Negative for pain and visual disturbance.  ENT neg for congestion or ST or sinus pain or ear pain  Respiratory: Negative for cough and shortness of breath.   Cardiovascular: Negative for cp or palpitations    Gastrointestinal: Negative for nausea, diarrhea and constipation.  Genitourinary: Negative for urgency and frequency.  Skin: Negative for pallor or rash   Neurological: Negative for weakness, light-headedness, numbness and headaches.  Hematological: Negative for adenopathy. Does not bruise/bleed easily.  Psychiatric/Behavioral: Negative for dysphoric mood. The patient is not nervous/anxious.         Objective:   Physical Exam  Constitutional: She appears well-developed and well-nourished. No distress.  HENT:  Head: Normocephalic and atraumatic.  Mouth/Throat: Oropharynx is clear and moist.       Inner lower lip on left - 3/4 cm soft lesion that is  round and mildly tender - slight hypopigmentation  Mucous can be expressed from it  No other mouth lesions  Throat is clear   Eyes: Conjunctivae and EOM are normal. Pupils are equal, round, and reactive to light.  Neck: Normal range of motion. Neck supple. No JVD present. No thyromegaly present.  Cardiovascular: Normal rate, regular rhythm and normal heart sounds.   Pulmonary/Chest: Effort normal and breath sounds normal. No respiratory distress. She has no wheezes.  Lymphadenopathy:    She has no cervical adenopathy.  Neurological: She is alert. No cranial nerve deficit.  Skin: Skin is warm and dry. No rash noted.  Psychiatric: She has a normal mood and affect.          Assessment & Plan:

## 2011-11-04 ENCOUNTER — Other Ambulatory Visit: Payer: Self-pay | Admitting: Family Medicine

## 2011-11-04 NOTE — Telephone Encounter (Signed)
[facility] request refill valtrex. Last filled 10/02/11. Please advise.

## 2011-11-04 NOTE — Telephone Encounter (Signed)
Will refill electronically  

## 2012-02-23 ENCOUNTER — Other Ambulatory Visit: Payer: BC Managed Care – PPO

## 2012-02-29 ENCOUNTER — Encounter: Payer: BC Managed Care – PPO | Admitting: Family Medicine

## 2012-03-05 ENCOUNTER — Inpatient Hospital Stay (HOSPITAL_COMMUNITY)
Admission: AD | Admit: 2012-03-05 | Discharge: 2012-03-05 | Disposition: A | Discharge status: Home / Self Care | Payer: BC Managed Care – PPO | Source: Ambulatory Visit | Attending: Obstetrics and Gynecology | Admitting: Obstetrics and Gynecology

## 2012-03-05 ENCOUNTER — Encounter (HOSPITAL_COMMUNITY): Payer: Self-pay | Admitting: *Deleted

## 2012-03-05 DIAGNOSIS — K219 Gastro-esophageal reflux disease without esophagitis: Secondary | ICD-10-CM

## 2012-03-05 DIAGNOSIS — R109 Unspecified abdominal pain: Secondary | ICD-10-CM | POA: Insufficient documentation

## 2012-03-05 DIAGNOSIS — M549 Dorsalgia, unspecified: Secondary | ICD-10-CM

## 2012-03-05 DIAGNOSIS — O99891 Other specified diseases and conditions complicating pregnancy: Secondary | ICD-10-CM | POA: Insufficient documentation

## 2012-03-05 DIAGNOSIS — O99619 Diseases of the digestive system complicating pregnancy, unspecified trimester: Secondary | ICD-10-CM

## 2012-03-05 DIAGNOSIS — O26899 Other specified pregnancy related conditions, unspecified trimester: Secondary | ICD-10-CM

## 2012-03-05 LAB — COMPREHENSIVE METABOLIC PANEL
ALT: 12 U/L (ref 0–35)
AST: 20 U/L (ref 0–37)
Alkaline Phosphatase: 61 U/L (ref 39–117)
CO2: 23 mEq/L (ref 19–32)
Chloride: 102 mEq/L (ref 96–112)
GFR calc Af Amer: >90 mL/min (ref >90)
GFR calc non Af Amer: >90 mL/min (ref >90)
Glucose, Bld: 72 mg/dL (ref 70–99)
Potassium: 3.9 mEq/L (ref 3.5–5.1)
Sodium: 134 mEq/L — ABNORMAL LOW (ref 135–145)

## 2012-03-05 LAB — CBC WITH DIFFERENTIAL/PLATELET
Basophils Relative: 0 % (ref 0–1)
Eosinophils Absolute: 0 10*3/uL (ref 0.0–0.7)
Eosinophils Relative: 0 % (ref 0–5)
MCH: 32.7 pg (ref 26.0–34.0)
MCHC: 35 g/dL (ref 30.0–36.0)
MCV: 93.4 fL (ref 78.0–100.0)
Monocytes Relative: 10 % (ref 3–12)
Neutrophils Relative %: 70 % (ref 43–77)
Platelets: 181 10*3/uL (ref 150–400)

## 2012-03-05 LAB — URINALYSIS, ROUTINE W REFLEX MICROSCOPIC
Bilirubin Urine: NEGATIVE
Glucose, UA: NEGATIVE mg/dL
Hgb urine dipstick: NEGATIVE
Ketones, ur: NEGATIVE mg/dL
pH: 6 (ref 5.0–8.0)

## 2012-03-05 LAB — URINE MICROSCOPIC-ADD ON

## 2012-03-05 MED ORDER — GI COCKTAIL ~~LOC~~
30.0000 mL | Freq: Once | ORAL | Status: AC
  Administered 2012-03-05: 30 mL via ORAL
  Filled 2012-03-05: qty 30

## 2012-03-05 MED ORDER — ACETAMINOPHEN 500 MG PO TABS
1000.0000 mg | ORAL_TABLET | Freq: Four times a day (QID) | ORAL | Status: DC | PRN
  Administered 2012-03-05: 1000 mg via ORAL
  Filled 2012-03-05: qty 2

## 2012-03-05 MED ORDER — RANITIDINE HCL 150 MG PO TABS: 150.0000 mg | ORAL_TABLET | Freq: Two times a day (BID) | ORAL | Status: DC | PRN

## 2012-03-05 NOTE — MAU Note (Signed)
Reports having throbbing pain in her back that is constant and also having pain just below the sternum that comes and goes. Good fetal movement and a clear mucusy vaginal discharge.

## 2012-03-05 NOTE — MAU Provider Note (Signed)
History     CSN: [id number]  Arrival date and time: 03/05/12 1328   First Provider Initiated Contact with Patient 03/05/12 1405      Chief Complaint  Patient presents with  . Back Pain   HPI 26 y.o. G1P0 at [redacted]w[redacted]d with sudden onset back pain that woke her up this morning. Pain is mid back, throbbing. Has had low back pain during pregnancy but this is different. Pain feels like it goes down her spine. Worse with flexing, twisting, walking/standing. Slightly better at rest. No bowel/bladder changes or numbness/weakness. Epigastric abdominal pain started at 10 AM, also throbbing, like someone punched her in stomach. Had nausea earlier and is mildly queasy now but no vomiting. No fever/chills. No dysuria or urinary problems. No diarrhea/constipation. Taking PO find today. No new activities or lifting or known back injury. Some mild occasional heartburn this pregnancy but not recently. No sick contacts.   No contractions, vaginal bleeding or loss of fluid. Baby moving - a little less than normal.  Last office visit 10/24, next is at end of November. No problems with pregnancy.   OB History    Grav Para Term Preterm Abortions TAB SAB Ect Mult Living   1               Past Medical History  Diagnosis Date  . Abnormal Pap smear of cervix   . GERD (gastroesophageal reflux disease)   . Palpitations   . Stress reaction   . HSV-2 infection     Past Surgical History  Procedure Date  . Tonsillectomy   . Knee surgery   . Wisdom tooth extraction     Family History  Problem Relation Age of Onset  . Hypertension Father   . Hyperlipidemia Father   . Leukemia Paternal Uncle   . Cancer Paternal Grandfather     prostate  . Alcohol abuse Paternal Grandfather   . Hyperlipidemia Paternal Grandfather     History  Substance Use Topics  . Smoking status: Never Smoker   . Smokeless tobacco: Not on file  . Alcohol Use: Yes    Allergies:  Allergies  Allergen Reactions  . Cefadroxil     REACTION: rash  . Medroxyprogesterone Acetate     GI   . Naproxen     REACTION: diarrhea  . Ortho Tri-Cyclen (Norgestimate-Eth Estradiol)     Heavy menses    Prescriptions prior to admission  Medication Sig Dispense Refill  . lamoTRIgine (LAMICTAL) 150 MG tablet Take 150 mg by mouth daily.        . Multiple Vitamin (MULTIVITAMIN) tablet Take 1 tablet by mouth daily.        . valACYclovir (VALTREX) 500 MG tablet TAKE ONE (1) TABLET BY MOUTH EVERY      DAY  30 tablet  11    Review of Systems  Constitutional: Negative for fever, chills and diaphoresis.  Eyes: Negative for blurred vision and double vision.  Respiratory: Negative for cough.   Cardiovascular: Negative for chest pain and palpitations.  Gastrointestinal: Positive for nausea and abdominal pain. Negative for heartburn, vomiting, diarrhea and constipation.  Genitourinary: Negative for dysuria, urgency and flank pain.  Musculoskeletal: Positive for back pain.  Neurological: Negative for dizziness, weakness and headaches.   Physical Exam   Temperature 98.3 F (36.8 C), temperature source Oral, height 5\' 4"  (1.626 m), weight 67.042 kg (147 lb 12.8 oz), last menstrual period 09/17/2011.  Physical Exam  Constitutional: She is oriented to person, place, and time.  She appears well-developed and well-nourished. No distress.       Well looking.   HENT:  Head: Normocephalic and atraumatic.  Eyes: Conjunctivae normal and EOM are normal.  Neck: Normal range of motion. Neck supple.  Cardiovascular: Normal rate, regular rhythm and normal heart sounds.   Respiratory: Breath sounds normal. No respiratory distress. She has no wheezes.  GI: Soft. Bowel sounds are normal. There is tenderness (mild epigastric). There is no rebound and no guarding.  Musculoskeletal: Normal range of motion. She exhibits no edema and no tenderness.       Mild tenderness over mid-thoracic spine to lumbar spine.   Neurological: She is alert and oriented to  person, place, and time.       No focal deficits, moves all extremities.   Skin: Skin is warm and dry.  Psychiatric: She has a normal mood and affect.   Results for orders placed during the hospital encounter of 03/05/12 (from the past 24 hour(s))  URINALYSIS, ROUTINE W REFLEX MICROSCOPIC     Status: Abnormal   Collection Time   03/05/12  1:40 PM      Component Value Range   Color, Urine YELLOW  YELLOW   APPearance CLEAR  CLEAR   Specific Gravity, Urine 1.015  1.005 - 1.030   pH 6.0  5.0 - 8.0   Glucose, UA NEGATIVE  NEGATIVE mg/dL   Hgb urine dipstick NEGATIVE  NEGATIVE   Bilirubin Urine NEGATIVE  NEGATIVE   Ketones, ur NEGATIVE  NEGATIVE mg/dL   Protein, ur NEGATIVE  NEGATIVE mg/dL   Urobilinogen, UA 0.2  0.0 - 1.0 mg/dL   Nitrite NEGATIVE  NEGATIVE   Leukocytes, UA TRACE (*) NEGATIVE  URINE MICROSCOPIC-ADD ON     Status: Abnormal   Collection Time   03/05/12  1:40 PM      Component Value Range   Squamous Epithelial / LPF FEW (*) RARE   WBC, UA 0-2  <3 WBC/hpf   RBC / HPF 0-2  <3 RBC/hpf   Bacteria, UA RARE  RARE  COMPREHENSIVE METABOLIC PANEL     Status: Abnormal   Collection Time   03/05/12  2:22 PM      Component Value Range   Sodium 134 (*) 135 - 145 mEq/L   Potassium 3.9  3.5 - 5.1 mEq/L   Chloride 102  96 - 112 mEq/L   CO2 23  19 - 32 mEq/L   Glucose, Bld 72  70 - 99 mg/dL   BUN 9  6 - 23 mg/dL   Creatinine, Ser 1.61 (*) 0.50 - 1.10 mg/dL   Calcium 9.2  8.4 - 09.6 mg/dL   Total Protein 6.3  6.0 - 8.3 g/dL   Albumin 3.0 (*) 3.5 - 5.2 g/dL   AST 20  0 - 37 U/L   ALT 12  0 - 35 U/L   Alkaline Phosphatase 61  39 - 117 U/L   Total Bilirubin 0.2 (*) 0.3 - 1.2 mg/dL   GFR calc non Af Amer >90  >90 mL/min   GFR calc Af Amer >90  >90 mL/min  LIPASE, BLOOD     Status: Normal   Collection Time   03/05/12  2:22 PM      Component Value Range   Lipase 29  11 - 59 U/L  CBC WITH DIFFERENTIAL     Status: Abnormal   Collection Time   03/05/12  2:22 PM      Component Value  Range   WBC  9.0  4.0 - 10.5 K/uL   RBC 3.64 (*) 3.87 - 5.11 MIL/uL   Hemoglobin 11.9 (*) 12.0 - 15.0 g/dL   HCT 78.2 (*) 95.6 - 21.3 %   MCV 93.4  78.0 - 100.0 fL   MCH 32.7  26.0 - 34.0 pg   MCHC 35.0  30.0 - 36.0 g/dL   RDW 08.6  57.8 - 46.9 %   Platelets 181  150 - 400 K/uL   Neutrophils Relative 70  43 - 77 %   Neutro Abs 6.3  1.7 - 7.7 K/uL   Lymphocytes Relative 20  12 - 46 %   Lymphs Abs 1.8  0.7 - 4.0 K/uL   Monocytes Relative 10  3 - 12 %   Monocytes Absolute 0.9  0.1 - 1.0 K/uL   Eosinophils Relative 0  0 - 5 %   Eosinophils Absolute 0.0  0.0 - 0.7 K/uL   Basophils Relative 0  0 - 1 %   Basophils Absolute 0.0  0.0 - 0.1 K/uL     MAU Course  Procedures  NST:  150 baseline, moderate variability, accels present, no decels Toco:  No contractions.  Tylenol - back pain somewhat better GI cocktail - abdominal pain resolved  Assessment and Plan  26 y.o. G1P0 at [redacted]w[redacted]d with 1.  Back pain. Likely musculoskeletal. Tylenol. Handout given. F/U with OB - might benefit from PT. 2.  Abdominal pain - likely reflux. Responded to GI cocktail. DC with ranitidine prn. 3.  Lipase pending, Urine sent for culture. Low suspicion for either as cause of pain. 4.  Discussed with Dr. Henderson Cloud. F/U as scheduled or sooner if symptoms worsen.  Napoleon Form 03/05/2012, 2:07 PM

## 2012-03-06 LAB — URINE CULTURE
Colony Count: NO GROWTH
Culture: NO GROWTH

## 2012-04-08 ENCOUNTER — Inpatient Hospital Stay (HOSPITAL_COMMUNITY)
Admission: AD | Admit: 2012-04-08 | Discharge: 2012-04-08 | Disposition: A | Discharge status: Home / Self Care | Payer: BC Managed Care – PPO | Source: Ambulatory Visit | Attending: Obstetrics and Gynecology | Admitting: Obstetrics and Gynecology

## 2012-04-08 ENCOUNTER — Encounter (HOSPITAL_COMMUNITY): Payer: Self-pay | Admitting: Family

## 2012-04-08 ENCOUNTER — Inpatient Hospital Stay (HOSPITAL_COMMUNITY): Payer: BC Managed Care – PPO

## 2012-04-08 DIAGNOSIS — R109 Unspecified abdominal pain: Secondary | ICD-10-CM | POA: Insufficient documentation

## 2012-04-08 DIAGNOSIS — O4690 Antepartum hemorrhage, unspecified, unspecified trimester: Secondary | ICD-10-CM | POA: Diagnosis present

## 2012-04-08 HISTORY — DX: Adverse effect of unspecified anesthetic, initial encounter: T41.45XA

## 2012-04-08 HISTORY — DX: Other complications of anesthesia, initial encounter: T88.59XA

## 2012-04-08 MED ORDER — TERBUTALINE SULFATE 1 MG/ML IJ SOLN: 0.2500 mg | Freq: Once | INTRAMUSCULAR | Status: DC

## 2012-04-08 MED ORDER — NIFEDIPINE 10 MG PO CAPS
20.0000 mg | ORAL_CAPSULE | Freq: Once | ORAL | Status: AC
  Administered 2012-04-08: 20 mg via ORAL
  Filled 2012-04-08: qty 2

## 2012-04-08 NOTE — MAU Note (Addendum)
Pt reports noticed red blood on sheets at 0615 this morning; has changed 2 pads since that time. Had sexual intercourse this morning.

## 2012-04-08 NOTE — MAU Provider Note (Signed)
Chief Complaint:  Vaginal Bleeding   First Provider Initiated Contact with Patient 04/08/12 [phone]      HPI: Kayla Chavez is a 26 y.o. G1P0 at [redacted]w[redacted]d who presents to maternity admissions reporting Onset of bowel at 6:30 of bright red vaginal bleeding which she first noticed on the sheets after intercourse. States she's used 2 pads and it is as heavy as a light period. No previous episodes VB.  Aware of mild lower abdominal cramping. Denies leakage of fluid. Good fetal movement.   Pregnancy Course: Uncomplicated. Hx cryo. O+.  Past Medical History: Past Medical History  Diagnosis Date  . Abnormal Pap smear of cervix   . GERD (gastroesophageal reflux disease)   . Palpitations   . Stress reaction   . HSV-2 infection   . Complication of anesthesia     during wisdom teeth extraction, experience tachycardia with anesthesia     Past obstetric history: OB History    Grav Para Term Preterm Abortions TAB SAB Ect Mult Living   1              # Outc Date GA Lbr Len/2nd Wgt Sex Del Anes PTL Lv   1 CUR               Past Surgical History: Past Surgical History  Procedure Date  . Tonsillectomy   . Knee surgery   . Wisdom tooth extraction     Family History: Family History  Problem Relation Age of Onset  . Hypertension Father   . Hyperlipidemia Father   . Leukemia Paternal Uncle   . Cancer Paternal Grandfather     prostate  . Alcohol abuse Paternal Grandfather   . Hyperlipidemia Paternal Grandfather     Social History: History  Substance Use Topics  . Smoking status: Never Smoker   . Smokeless tobacco: Not on file  . Alcohol Use: Yes    Allergies:  Allergies  Allergen Reactions  . Naproxen Diarrhea  . Ortho Tri-Cyclen (Norgestimate-Eth Estradiol)     Heavy menses  . Other     STEROIDS:  Causes upset stomach/severe cramps/mood swings  . Duricef (Cefadroxil) Rash    Meds:  Prescriptions prior to admission  Medication Sig Dispense Refill  . folic acid (FOLVITE)  1 MG tablet Take 1 mg by mouth 2 (two) times daily.      Marland Kitchen lamoTRIgine (LAMICTAL) 150 MG tablet Take 150 mg by mouth at bedtime.       . Prenatal Vit-Fe Fumarate-FA (PRENATAL MULTIVITAMIN) TABS Take 1 tablet by mouth at bedtime.      . ranitidine (ZANTAC) 150 MG tablet Take 1 tablet (150 mg total) by mouth 2 (two) times daily as needed for heartburn.  60 tablet  0    ROS: Pertinent findings in history of present illness.  Physical Exam  Blood pressure 117/68, pulse 100, temperature 98.2 F (36.8 C), temperature source Oral, resp. rate 18, height 5\' 5"  (1.651 m), weight 155 lb (70.308 kg), last menstrual period 09/17/2011. GENERAL: Well-developed, well-nourished female in no acute distress.  HEENT: normocephalic HEART: normal rate RESP: normal effort ABDOMEN: Soft, non-tender, gravid appropriate for gestational age EXTREMITIES: Nontender, no edema NEURO: alert and oriented SPECULUM EXAM: NEFG, physiologic discharge, dark blood like bloody show , cervix clean    FHT:  Baseline 145-150 , moderate variability, accelerations present, no decelerations Contractions: q 10 mins with UI   Labs: Results for orders placed during the hospital encounter of 04/08/12 (from the past 24 hour(s))  KLEIHAUER-BETKE STAIN     Status: Normal   Collection Time   04/08/12  8:10 AM      Component Value Range   Fetal Cells % 0.0     Quantitation Fetal Hemoglobin 0      Imaging:    Assessment: 1. Vaginal bleeding in pregnancy     Plan: C/W Dr. Rana Snare: terbutaline, K-B, limited US HX palpitations and pulse>100 > will give Procardia 20 mg po   Danae Orleans, CNM 04/08/2012 7:45 AM  Care assumed by Collene Gobble, CNM at [phone]  Called Dr Rana Snare to review irregular cramping/irritability, U/S and lab results.   Pt does report improvement in symptoms and bleeding reduced since arrival in MAU.  D/C home with plan to f/u in office on Monday.   Bed rest and pelvic rest Return to MAU as needed  Sharen Counter Certified Nurse-Midwife

## 2012-06-03 ENCOUNTER — Encounter (HOSPITAL_COMMUNITY): Payer: Self-pay | Admitting: Pharmacist

## 2012-06-09 ENCOUNTER — Encounter (HOSPITAL_COMMUNITY): Payer: Self-pay | Admitting: Anesthesiology

## 2012-06-09 ENCOUNTER — Encounter (HOSPITAL_COMMUNITY): Payer: Self-pay | Admitting: *Deleted

## 2012-06-09 ENCOUNTER — Inpatient Hospital Stay (HOSPITAL_COMMUNITY): Payer: BC Managed Care – PPO | Admitting: Anesthesiology

## 2012-06-09 ENCOUNTER — Inpatient Hospital Stay (HOSPITAL_COMMUNITY)
Admission: RE | Admit: 2012-06-09 | Payer: BC Managed Care – PPO | Source: Ambulatory Visit | Admitting: Obstetrics and Gynecology

## 2012-06-09 ENCOUNTER — Encounter (HOSPITAL_COMMUNITY)
Admission: AD | Disposition: A | Discharge status: Home / Self Care | Payer: Self-pay | Source: Ambulatory Visit | Attending: Obstetrics and Gynecology

## 2012-06-09 ENCOUNTER — Inpatient Hospital Stay (HOSPITAL_COMMUNITY)
Admission: AD | Admit: 2012-06-09 | Discharge: 2012-06-11 | DRG: 371 | Disposition: A | Discharge status: Home / Self Care | Payer: BC Managed Care – PPO | Source: Ambulatory Visit | Attending: Obstetrics and Gynecology | Admitting: Obstetrics and Gynecology

## 2012-06-09 DIAGNOSIS — O4690 Antepartum hemorrhage, unspecified, unspecified trimester: Secondary | ICD-10-CM

## 2012-06-09 DIAGNOSIS — R7989 Other specified abnormal findings of blood chemistry: Secondary | ICD-10-CM

## 2012-06-09 DIAGNOSIS — O98519 Other viral diseases complicating pregnancy, unspecified trimester: Principal | ICD-10-CM | POA: Diagnosis present

## 2012-06-09 DIAGNOSIS — Z113 Encounter for screening for infections with a predominantly sexual mode of transmission: Secondary | ICD-10-CM

## 2012-06-09 DIAGNOSIS — K219 Gastro-esophageal reflux disease without esophagitis: Secondary | ICD-10-CM

## 2012-06-09 DIAGNOSIS — K130 Diseases of lips: Secondary | ICD-10-CM

## 2012-06-09 DIAGNOSIS — D72819 Decreased white blood cell count, unspecified: Secondary | ICD-10-CM

## 2012-06-09 DIAGNOSIS — R109 Unspecified abdominal pain: Secondary | ICD-10-CM

## 2012-06-09 DIAGNOSIS — Z0000 Encounter for general adult medical examination without abnormal findings: Secondary | ICD-10-CM

## 2012-06-09 DIAGNOSIS — R87610 Atypical squamous cells of undetermined significance on cytologic smear of cervix (ASC-US): Secondary | ICD-10-CM

## 2012-06-09 DIAGNOSIS — R5381 Other malaise: Secondary | ICD-10-CM

## 2012-06-09 DIAGNOSIS — L708 Other acne: Secondary | ICD-10-CM

## 2012-06-09 DIAGNOSIS — J069 Acute upper respiratory infection, unspecified: Secondary | ICD-10-CM

## 2012-06-09 DIAGNOSIS — F319 Bipolar disorder, unspecified: Secondary | ICD-10-CM

## 2012-06-09 DIAGNOSIS — A6004 Herpesviral vulvovaginitis: Secondary | ICD-10-CM

## 2012-06-09 DIAGNOSIS — A6000 Herpesviral infection of urogenital system, unspecified: Secondary | ICD-10-CM | POA: Diagnosis present

## 2012-06-09 DIAGNOSIS — Z98891 History of uterine scar from previous surgery: Secondary | ICD-10-CM

## 2012-06-09 DIAGNOSIS — R040 Epistaxis: Secondary | ICD-10-CM

## 2012-06-09 LAB — CBC
HCT: 34.1 % — ABNORMAL LOW (ref 36.0–46.0)
MCHC: 35.2 g/dL (ref 30.0–36.0)
RDW: 12.6 % (ref 11.5–15.5)

## 2012-06-09 SURGERY — Surgical Case
Anesthesia: Regional

## 2012-06-09 SURGERY — Surgical Case
Anesthesia: Epidural | Site: Abdomen | Wound class: Clean Contaminated

## 2012-06-09 MED ORDER — TETANUS-DIPHTH-ACELL PERTUSSIS 5-2.5-18.5 LF-MCG/0.5 IM SUSP: 0.5000 mL | Freq: Once | INTRAMUSCULAR | Status: DC

## 2012-06-09 MED ORDER — NALBUPHINE HCL 10 MG/ML IJ SOLN
5.0000 mg | INTRAMUSCULAR | Status: DC | PRN
  Filled 2012-06-09: qty 1

## 2012-06-09 MED ORDER — OXYTOCIN 40 UNITS IN LACTATED RINGERS INFUSION - SIMPLE MED: 62.5000 mL/h | INTRAVENOUS | Status: AC

## 2012-06-09 MED ORDER — NALOXONE HCL 0.4 MG/ML IJ SOLN: 0.4000 mg | INTRAMUSCULAR | Status: DC | PRN

## 2012-06-09 MED ORDER — FAMOTIDINE 20 MG PO TABS: 20.0000 mg | ORAL_TABLET | Freq: Two times a day (BID) | ORAL | Status: DC | PRN

## 2012-06-09 MED ORDER — SODIUM CHLORIDE 0.9 % IV SOLN: 250.0000 mL | INTRAVENOUS | Status: DC

## 2012-06-09 MED ORDER — DIPHENHYDRAMINE HCL 50 MG/ML IJ SOLN: 25.0000 mg | INTRAMUSCULAR | Status: DC | PRN

## 2012-06-09 MED ORDER — OXYTOCIN 10 UNIT/ML IJ SOLN
40.0000 [IU] | INTRAVENOUS | Status: DC | PRN
  Administered 2012-06-09: 40 [IU] via INTRAVENOUS

## 2012-06-09 MED ORDER — LACTATED RINGERS IV SOLN
INTRAVENOUS | Status: DC | PRN
  Administered 2012-06-09: 04:00:00 via INTRAVENOUS

## 2012-06-09 MED ORDER — ONDANSETRON HCL 4 MG/2ML IJ SOLN: 4.0000 mg | Freq: Three times a day (TID) | INTRAMUSCULAR | Status: DC | PRN

## 2012-06-09 MED ORDER — MIDAZOLAM HCL 2 MG/2ML IJ SOLN: 0.5000 mg | Freq: Once | INTRAMUSCULAR | Status: DC | PRN

## 2012-06-09 MED ORDER — SODIUM CHLORIDE 0.9 % IJ SOLN: 3.0000 mL | INTRAMUSCULAR | Status: DC | PRN

## 2012-06-09 MED ORDER — WITCH HAZEL-GLYCERIN EX PADS: 1.0000 "application " | MEDICATED_PAD | CUTANEOUS | Status: DC | PRN

## 2012-06-09 MED ORDER — DIBUCAINE 1 % RE OINT: 1.0000 "application " | TOPICAL_OINTMENT | RECTAL | Status: DC | PRN

## 2012-06-09 MED ORDER — MENTHOL 3 MG MT LOZG: 1.0000 | LOZENGE | OROMUCOSAL | Status: DC | PRN

## 2012-06-09 MED ORDER — SCOPOLAMINE 1 MG/3DAYS TD PT72
1.0000 | MEDICATED_PATCH | Freq: Once | TRANSDERMAL | Status: DC
  Administered 2012-06-09: 1.5 mg via TRANSDERMAL

## 2012-06-09 MED ORDER — 0.9 % SODIUM CHLORIDE (POUR BTL) OPTIME
TOPICAL | Status: DC | PRN
  Administered 2012-06-09: 1000 mL

## 2012-06-09 MED ORDER — ZOLPIDEM TARTRATE 5 MG PO TABS: 5.0000 mg | ORAL_TABLET | Freq: Every evening | ORAL | Status: DC | PRN

## 2012-06-09 MED ORDER — ONDANSETRON HCL 4 MG/2ML IJ SOLN
INTRAMUSCULAR | Status: DC | PRN
  Administered 2012-06-09: 4 mg via INTRAVENOUS

## 2012-06-09 MED ORDER — SENNOSIDES-DOCUSATE SODIUM 8.6-50 MG PO TABS
2.0000 | ORAL_TABLET | Freq: Every day | ORAL | Status: DC
  Administered 2012-06-09 – 2012-06-10 (×2): 2 via ORAL

## 2012-06-09 MED ORDER — FENTANYL CITRATE 0.05 MG/ML IJ SOLN
INTRAMUSCULAR | Status: DC | PRN
  Administered 2012-06-09: 25 ug via INTRATHECAL

## 2012-06-09 MED ORDER — DIPHENHYDRAMINE HCL 25 MG PO CAPS: 25.0000 mg | ORAL_CAPSULE | Freq: Four times a day (QID) | ORAL | Status: DC | PRN

## 2012-06-09 MED ORDER — PHENYLEPHRINE HCL 10 MG/ML IJ SOLN
INTRAMUSCULAR | Status: DC | PRN
  Administered 2012-06-09 (×3): 40 ug via INTRAVENOUS
  Administered 2012-06-09: 80 ug via INTRAVENOUS

## 2012-06-09 MED ORDER — SIMETHICONE 80 MG PO CHEW
80.0000 mg | CHEWABLE_TABLET | ORAL | Status: DC | PRN
  Administered 2012-06-10 – 2012-06-11 (×2): 80 mg via ORAL

## 2012-06-09 MED ORDER — LACTATED RINGERS IV SOLN
INTRAVENOUS | Status: DC
  Administered 2012-06-09: 11:00:00 via INTRAVENOUS

## 2012-06-09 MED ORDER — MEPERIDINE HCL 25 MG/ML IJ SOLN
6.2500 mg | INTRAMUSCULAR | Status: DC | PRN
  Administered 2012-06-09 (×2): 6.25 mg via INTRAVENOUS

## 2012-06-09 MED ORDER — SODIUM CHLORIDE 0.9 % IJ SOLN: 3.0000 mL | Freq: Two times a day (BID) | INTRAMUSCULAR | Status: DC

## 2012-06-09 MED ORDER — ACETAMINOPHEN 10 MG/ML IV SOLN
1000.0000 mg | Freq: Four times a day (QID) | INTRAVENOUS | Status: AC | PRN
  Filled 2012-06-09: qty 100

## 2012-06-09 MED ORDER — FAMOTIDINE IN NACL 20-0.9 MG/50ML-% IV SOLN
20.0000 mg | Freq: Once | INTRAVENOUS | Status: AC
  Administered 2012-06-09: 20 mg via INTRAVENOUS
  Filled 2012-06-09: qty 50

## 2012-06-09 MED ORDER — MORPHINE SULFATE (PF) 0.5 MG/ML IJ SOLN
INTRAMUSCULAR | Status: DC | PRN
  Administered 2012-06-09: 100 ug via INTRATHECAL

## 2012-06-09 MED ORDER — IBUPROFEN 800 MG PO TABS
800.0000 mg | ORAL_TABLET | Freq: Three times a day (TID) | ORAL | Status: DC | PRN
  Administered 2012-06-09 – 2012-06-11 (×4): 800 mg via ORAL
  Filled 2012-06-09 (×5): qty 1

## 2012-06-09 MED ORDER — OXYCODONE-ACETAMINOPHEN 5-325 MG PO TABS
1.0000 | ORAL_TABLET | Freq: Four times a day (QID) | ORAL | Status: DC | PRN
  Administered 2012-06-10 (×3): 1 via ORAL
  Filled 2012-06-09 (×3): qty 1

## 2012-06-09 MED ORDER — FENTANYL CITRATE 0.05 MG/ML IJ SOLN: 25.0000 ug | INTRAMUSCULAR | Status: DC | PRN

## 2012-06-09 MED ORDER — LANOLIN HYDROUS EX OINT: 1.0000 "application " | TOPICAL_OINTMENT | CUTANEOUS | Status: DC | PRN

## 2012-06-09 MED ORDER — ONDANSETRON HCL 4 MG/2ML IJ SOLN: 4.0000 mg | INTRAMUSCULAR | Status: DC | PRN

## 2012-06-09 MED ORDER — LACTATED RINGERS IV SOLN
INTRAVENOUS | Status: DC
  Administered 2012-06-09 (×4): via INTRAVENOUS

## 2012-06-09 MED ORDER — GENTAMICIN SULFATE 40 MG/ML IJ SOLN
INTRAVENOUS | Status: AC
  Administered 2012-06-09: 04:00:00 via INTRAVENOUS
  Filled 2012-06-09: qty 8.06

## 2012-06-09 MED ORDER — BISACODYL 10 MG RE SUPP: 10.0000 mg | Freq: Every day | RECTAL | Status: DC | PRN

## 2012-06-09 MED ORDER — PRENATAL MULTIVITAMIN CH
1.0000 | ORAL_TABLET | Freq: Every day | ORAL | Status: DC
  Administered 2012-06-10 – 2012-06-11 (×2): 1 via ORAL
  Filled 2012-06-09 (×2): qty 1

## 2012-06-09 MED ORDER — BUPIVACAINE IN DEXTROSE 0.75-8.25 % IT SOLN
INTRATHECAL | Status: DC | PRN
  Administered 2012-06-09: 12 mg via INTRATHECAL

## 2012-06-09 MED ORDER — FLEET ENEMA 7-19 GM/118ML RE ENEM: 1.0000 | ENEMA | Freq: Every day | RECTAL | Status: DC | PRN

## 2012-06-09 MED ORDER — PROMETHAZINE HCL 25 MG/ML IJ SOLN: 6.2500 mg | INTRAMUSCULAR | Status: DC | PRN

## 2012-06-09 MED ORDER — SIMETHICONE 80 MG PO CHEW
80.0000 mg | CHEWABLE_TABLET | Freq: Three times a day (TID) | ORAL | Status: DC
  Administered 2012-06-09 – 2012-06-11 (×5): 80 mg via ORAL

## 2012-06-09 MED ORDER — MEPERIDINE HCL 25 MG/ML IJ SOLN: 6.2500 mg | INTRAMUSCULAR | Status: DC | PRN

## 2012-06-09 MED ORDER — CITRIC ACID-SODIUM CITRATE 334-500 MG/5ML PO SOLN
30.0000 mL | Freq: Once | ORAL | Status: AC
  Administered 2012-06-09: 30 mL via ORAL
  Filled 2012-06-09: qty 15

## 2012-06-09 MED ORDER — ONDANSETRON HCL 4 MG PO TABS: 4.0000 mg | ORAL_TABLET | ORAL | Status: DC | PRN

## 2012-06-09 MED ORDER — LAMOTRIGINE 150 MG PO TABS
150.0000 mg | ORAL_TABLET | Freq: Every day | ORAL | Status: DC
  Administered 2012-06-09 – 2012-06-10 (×2): 150 mg via ORAL
  Filled 2012-06-09 (×3): qty 1

## 2012-06-09 MED ORDER — DIPHENHYDRAMINE HCL 50 MG/ML IJ SOLN
12.5000 mg | INTRAMUSCULAR | Status: DC | PRN
  Administered 2012-06-09: 12.5 mg via INTRAVENOUS

## 2012-06-09 MED ORDER — DIPHENHYDRAMINE HCL 50 MG/ML IJ SOLN
INTRAMUSCULAR | Status: AC
  Filled 2012-06-09: qty 1

## 2012-06-09 MED ORDER — NALOXONE HCL 1 MG/ML IJ SOLN
1.0000 ug/kg/h | INTRAVENOUS | Status: DC | PRN
  Filled 2012-06-09: qty 2

## 2012-06-09 MED ORDER — GENTAMICIN SULFATE 40 MG/ML IJ SOLN: INTRAVENOUS | Status: DC

## 2012-06-09 MED ORDER — METOCLOPRAMIDE HCL 5 MG/ML IJ SOLN: 10.0000 mg | Freq: Three times a day (TID) | INTRAMUSCULAR | Status: DC | PRN

## 2012-06-09 MED ORDER — DIPHENHYDRAMINE HCL 25 MG PO CAPS
25.0000 mg | ORAL_CAPSULE | ORAL | Status: DC | PRN
  Filled 2012-06-09: qty 1

## 2012-06-09 MED ORDER — MEASLES, MUMPS & RUBELLA VAC ~~LOC~~ INJ
0.5000 mL | INJECTION | Freq: Once | SUBCUTANEOUS | Status: AC
  Administered 2012-06-11: 0.5 mL via SUBCUTANEOUS
  Filled 2012-06-09: qty 0.5

## 2012-06-09 SURGICAL SUPPLY — 31 items
BENZOIN TINCTURE PRP APPL 2/3 (GAUZE/BANDAGES/DRESSINGS) ×2 IMPLANT
BLADE SURG CLIPPER 3M 9600 (MISCELLANEOUS) ×2 IMPLANT
CLOTH BEACON ORANGE TIMEOUT ST (SAFETY) ×2 IMPLANT
DRAPE LG THREE QUARTER DISP (DRAPES) ×2 IMPLANT
DRESSING TELFA 8X3 (GAUZE/BANDAGES/DRESSINGS) IMPLANT
DRSG OPSITE POSTOP 4X10 (GAUZE/BANDAGES/DRESSINGS) ×2 IMPLANT
DURAPREP 26ML APPLICATOR (WOUND CARE) ×2 IMPLANT
ELECT REM PT RETURN 9FT ADLT (ELECTROSURGICAL) ×2
ELECTRODE REM PT RTRN 9FT ADLT (ELECTROSURGICAL) ×1 IMPLANT
EXTRACTOR VACUUM M CUP 4 TUBE (SUCTIONS) IMPLANT
GAUZE SPONGE 4X4 12PLY STRL LF (GAUZE/BANDAGES/DRESSINGS) IMPLANT
GLOVE BIO SURGEON STRL SZ7 (GLOVE) ×4 IMPLANT
GOWN PREVENTION PLUS LG XLONG (DISPOSABLE) ×6 IMPLANT
KIT ABG SYR 3ML LUER SLIP (SYRINGE) ×2 IMPLANT
NEEDLE HYPO 25X5/8 SAFETYGLIDE (NEEDLE) ×2 IMPLANT
NS IRRIG 1000ML POUR BTL (IV SOLUTION) ×2 IMPLANT
PACK C SECTION WH (CUSTOM PROCEDURE TRAY) ×2 IMPLANT
PAD ABD 7.5X8 STRL (GAUZE/BANDAGES/DRESSINGS) IMPLANT
PAD OB MATERNITY 4.3X12.25 (PERSONAL CARE ITEMS) ×2 IMPLANT
SLEEVE SCD COMPRESS KNEE MED (MISCELLANEOUS) ×2 IMPLANT
STRIP CLOSURE SKIN 1/2X4 (GAUZE/BANDAGES/DRESSINGS) IMPLANT
STRIP CLOSURE SKIN 1/4X4 (GAUZE/BANDAGES/DRESSINGS) ×2 IMPLANT
SUT CHROMIC 0 CTX 36 (SUTURE) ×6 IMPLANT
SUT MON AB 4-0 PS1 27 (SUTURE) ×2 IMPLANT
SUT PDS AB 0 CT1 27 (SUTURE) ×4 IMPLANT
SUT VIC AB 3-0 CT1 27 (SUTURE) ×2
SUT VIC AB 3-0 CT1 TAPERPNT 27 (SUTURE) ×2 IMPLANT
TOWEL OR 17X24 6PK STRL BLUE (TOWEL DISPOSABLE) ×4 IMPLANT
TRAY FOLEY CATH 14FR (SET/KITS/TRAYS/PACK) IMPLANT
TRAY FOLEY CATH 16FR SILVER (SET/KITS/TRAYS/PACK) ×2 IMPLANT
WATER STERILE IRR 1000ML POUR (IV SOLUTION) IMPLANT

## 2012-06-09 SURGICAL SUPPLY — 32 items
BENZOIN TINCTURE PRP APPL 2/3 (GAUZE/BANDAGES/DRESSINGS) IMPLANT
CLOTH BEACON ORANGE TIMEOUT ST (SAFETY) IMPLANT
CONTAINER PREFILL 10% NBF 15ML (MISCELLANEOUS) IMPLANT
DERMABOND ADVANCED (GAUZE/BANDAGES/DRESSINGS)
DERMABOND ADVANCED .7 DNX12 (GAUZE/BANDAGES/DRESSINGS) IMPLANT
DRAPE LG THREE QUARTER DISP (DRAPES) IMPLANT
DRESSING TELFA 8X3 (GAUZE/BANDAGES/DRESSINGS) IMPLANT
DRSG OPSITE POSTOP 4X10 (GAUZE/BANDAGES/DRESSINGS) IMPLANT
DURAPREP 26ML APPLICATOR (WOUND CARE) IMPLANT
ELECT REM PT RETURN 9FT ADLT (ELECTROSURGICAL)
ELECTRODE REM PT RTRN 9FT ADLT (ELECTROSURGICAL) IMPLANT
EXTRACTOR VACUUM M CUP 4 TUBE (SUCTIONS) IMPLANT
GAUZE SPONGE 4X4 12PLY STRL LF (GAUZE/BANDAGES/DRESSINGS) IMPLANT
GLOVE BIO SURGEON STRL SZ8 (GLOVE) IMPLANT
GOWN PREVENTION PLUS LG XLONG (DISPOSABLE) IMPLANT
KIT ABG SYR 3ML LUER SLIP (SYRINGE) IMPLANT
NEEDLE HYPO 25X5/8 SAFETYGLIDE (NEEDLE) IMPLANT
NS IRRIG 1000ML POUR BTL (IV SOLUTION) IMPLANT
PACK C SECTION WH (CUSTOM PROCEDURE TRAY) IMPLANT
PAD ABD 7.5X8 STRL (GAUZE/BANDAGES/DRESSINGS) IMPLANT
PAD OB MATERNITY 4.3X12.25 (PERSONAL CARE ITEMS) IMPLANT
SLEEVE SCD COMPRESS KNEE MED (MISCELLANEOUS) IMPLANT
STAPLER VISISTAT 35W (STAPLE) IMPLANT
STRIP CLOSURE SKIN 1/2X4 (GAUZE/BANDAGES/DRESSINGS) IMPLANT
SUT MNCRL 0 VIOLET CTX 36 (SUTURE) IMPLANT
SUT MONOCRYL 0 CTX 36 (SUTURE)
SUT PDS AB 0 CTX 60 (SUTURE) IMPLANT
SUT PLAIN 0 NONE (SUTURE) IMPLANT
SUT VIC AB 4-0 KS 27 (SUTURE) IMPLANT
TOWEL OR 17X24 6PK STRL BLUE (TOWEL DISPOSABLE) IMPLANT
TRAY FOLEY CATH 14FR (SET/KITS/TRAYS/PACK) IMPLANT
WATER STERILE IRR 1000ML POUR (IV SOLUTION) IMPLANT

## 2012-06-09 NOTE — Anesthesia Postprocedure Evaluation (Signed)
Anesthesia Post Note  Patient: Kayla Chavez  Procedure(s) Performed: Procedure(s) (LRB): CESAREAN SECTION (N/A)  Anesthesia type: Spinal  Patient location: Mother/Baby  Post pain: Pain level controlled  Post assessment: Post-op Vital signs reviewed  Last Vitals:  Filed Vitals:   06/09/12 1226  BP: 105/62  Pulse: 76  Temp:   Resp:     Post vital signs: Reviewed  Level of consciousness: awake  Complications: No apparent anesthesia complications

## 2012-06-09 NOTE — Anesthesia Preprocedure Evaluation (Signed)
Anesthesia Evaluation  Patient identified by MRN, date of birth, ID band Patient awake    Reviewed: Allergy & Precautions, H&P , NPO status , Patient's Chart, lab work & pertinent test results  Airway Mallampati: II      Dental No notable dental hx.    Pulmonary neg pulmonary ROS,  breath sounds clear to auscultation  Pulmonary exam normal       Cardiovascular Exercise Tolerance: Good negative cardio ROS  Rhythm:regular Rate:Normal     Neuro/Psych negative neurological ROS  negative psych ROS   GI/Hepatic negative GI ROS, Neg liver ROS, GERD-  ,  Endo/Other  negative endocrine ROS  Renal/GU negative Renal ROS  negative genitourinary   Musculoskeletal   Abdominal Normal abdominal exam  (+)   Peds  Hematology negative hematology ROS (+)   Anesthesia Other Findings Abnormal Pap smear of cervix     GERD (gastroesophageal reflux disease)        Palpitations     Stress reaction        HSV-2 infection     Complication of anesthesia   during wisdom teeth extraction, experience tachycardia with anesthesia     Reproductive/Obstetrics (+) Pregnancy                           Anesthesia Physical Anesthesia Plan  ASA: II and emergent  Anesthesia Plan: Spinal   Post-op Pain Management:    Induction:   Airway Management Planned:   Additional Equipment:   Intra-op Plan:   Post-operative Plan:   Informed Consent: I have reviewed the patients History and Physical, chart, labs and discussed the procedure including the risks, benefits and alternatives for the proposed anesthesia with the patient or authorized representative who has indicated his/her understanding and acceptance.     Plan Discussed with: Anesthesiologist, CRNA and Surgeon  Anesthesia Plan Comments:         Anesthesia Quick Evaluation

## 2012-06-09 NOTE — Anesthesia Preprocedure Evaluation (Signed)
Anesthesia Evaluation  Patient identified by MRN, date of birth, ID band Patient awake    Reviewed: Allergy & Precautions, H&P , NPO status , Patient's Chart, lab work & pertinent test results  Airway Mallampati: II TM Distance: >3 FB Neck ROM: full    Dental No notable dental hx.    Pulmonary neg pulmonary ROS,  breath sounds clear to auscultation  Pulmonary exam normal       Cardiovascular Exercise Tolerance: Good negative cardio ROS  Rhythm:regular Rate:Normal     Neuro/Psych negative neurological ROS  negative psych ROS   GI/Hepatic negative GI ROS, Neg liver ROS, GERD-  ,  Endo/Other  negative endocrine ROS  Renal/GU negative Renal ROS  negative genitourinary   Musculoskeletal   Abdominal Normal abdominal exam  (+)   Peds  Hematology negative hematology ROS (+)   Anesthesia Other Findings Abnormal Pap smear of cervix     GERD (gastroesophageal reflux disease)        Palpitations     Stress reaction        HSV-2 infection     Complication of anesthesia   during wisdom teeth extraction, experience tachycardia with anesthesia     Reproductive/Obstetrics (+) Pregnancy                           Anesthesia Physical  Anesthesia Plan  ASA: II and emergent  Anesthesia Plan: Epidural   Post-op Pain Management:    Induction:   Airway Management Planned:   Additional Equipment:   Intra-op Plan:   Post-operative Plan:   Informed Consent: I have reviewed the patients History and Physical, chart, labs and discussed the procedure including the risks, benefits and alternatives for the proposed anesthesia with the patient or authorized representative who has indicated his/her understanding and acceptance.     Plan Discussed with: Anesthesiologist, CRNA and Surgeon  Anesthesia Plan Comments:         Anesthesia Quick Evaluation

## 2012-06-09 NOTE — H&P (Signed)
Kayla Chavez is a 27 y.o. female presenting for SROM, sched for primary CS secondary to concerns asbout her + HSV hx, although no active lesions and she is on suppressive Valtrex. Maternal Medical History:  Reason for admission: Reason for admission: rupture of membranes.  Contractions: Onset was 1-2 hours ago.   Frequency: irregular.   Perceived severity is mild.    Fetal activity: Perceived fetal activity is normal.   Last perceived fetal movement was within the past hour.      OB History    Grav Para Term Preterm Abortions TAB SAB Ect Mult Living   1 0 0 0 0 0 0 0 0 0      Past Medical History  Diagnosis Date  . Abnormal Pap smear of cervix   . GERD (gastroesophageal reflux disease)   . Palpitations   . Stress reaction   . HSV-2 infection   . Complication of anesthesia     during wisdom teeth extraction, experience tachycardia with anesthesia    Past Surgical History  Procedure Date  . Tonsillectomy   . Knee surgery   . Wisdom tooth extraction    Family History: family history includes Alcohol abuse in her paternal grandfather; Cancer in her paternal grandfather; Hyperlipidemia in her father and paternal grandfather; Hypertension in her father; and Leukemia in her paternal uncle. Social History:  reports that she has never smoked. She does not have any smokeless tobacco history on file. She reports that she drinks alcohol. She reports that she does not use illicit drugs.   Prenatal Transfer Tool  Maternal Diabetes: No Genetic Screening: Normal Maternal Ultrasounds/Referrals: Normal Fetal Ultrasounds or other Referrals:  None Maternal Substance Abuse:  No Significant Maternal Medications:  Meds include: Other: hx Lamictal Significant Maternal Lab Results:  None Other Comments:  None  ROS  Dilation: 2 Effacement (%): 100 Station: -2 Exam by:: B Mosca Blood pressure 120/77, pulse 86, temperature 97.9 F (36.6 C), temperature source Oral, resp. rate 18, height  5\' 5"  (1.651 m), weight 75.841 kg (167 lb 3.2 oz), last menstrual period 09/17/2011. Maternal Exam:  Uterine Assessment: Contraction strength is moderate.  Contraction frequency is irregular.   Abdomen: Patient reports no abdominal tenderness. Fundal height is term FH.   Estimated fetal weight is AGA.   Fetal presentation: vertex  Introitus: Normal vulva. Normal vagina.  Vagina is negative for ulcerations.  Ferning test: positive.   Cervix: Cervix evaluated by digital exam.     Physical Exam  Constitutional: She is oriented to person, place, and time. She appears well-developed and well-nourished.  HENT:  Head: Normocephalic.  Eyes: Pupils are equal, round, and reactive to light.  Neck: Normal range of motion. Neck supple.  Cardiovascular: Normal rate and regular rhythm.   Respiratory: Effort normal and breath sounds normal.  GI:       Term FH, FHR 148  Genitourinary:       2/clear AF/vtx  Musculoskeletal: Normal range of motion.  Neurological: She is alert and oriented to person, place, and time.  Skin: Skin is warm and dry.    Prenatal labs: ABO, Rh:   Antibody:   Rubella:   RPR:    HBsAg:    HIV:    GBS:     Assessment/Plan: Term IUP, prev sched for primary CS for HSV concerns, no active lesions. Declines TOL, reviewed primary CS, procedure + risks   Tonantzin Mimnaugh M 06/09/2012, 3:31 AM

## 2012-06-09 NOTE — Op Note (Signed)
Preoperative diagnosis: Term pregnancy, SROM with early labor, history of HSV, no active lesions, patient preference for primary cesarean section  Postoperative diagnosis: Same  Procedure: Primary low transverse cesarean section  Surgeon: Marcelle Overlie  Anesthesia: Spinal  EBL: 700 cc  Drains: Foley catheter  Complications: None  Procedure and findings:  The patient taken the operative room after an adequate level of spinal anesthetic was obtained with the patient in left tilt position the abdomen prepped and draped in the usual fashion for cesarean section Foley catheter positioned draining clear urine. Appropriate timeout taken at that point. Penicillus is made 2 finger restaurant symphysis carried down to the fascia which was incised and extended transversely. Rectus muscles divided in the midline peritoneum entered superiorly without incident and extended in a vertical fashion. The vesicouterine serosa was incised and the bladder was bluntly and sharply dissected below. Bladder blade repositioned transverse incision made in the lower segment extended with blunt dissection lower uterine segment was fairly thin clear fluid noted the patient will delivered of a healthy infant, the infant was suctioned cord clamped and passed the pediatric team for further care. Placenta was then delivered manually intact, uterus exteriorized cavity wiped clean with laparotomy pack closure obtained the first layer of 0 chromic in a locked fashion followed by number day layer of 0 chromic. This is hemostatic bilateral tubes and ovaries were normal prior to closure sponge denies precast approach correct x2 peritoneum closed with a 2-0 Vicryl running suture. Rectus muscles reapproximated midline with a 2-0 Vicryl running suture. 0 PDS was then used from laterally to midline on either side to close the fascia subcutaneous tissue was hemostatic and fairly thin. 4-0 Monocryl subcuticular suture with a dressing applied she  tolerated this well went to recovery room in good condition.  Dictated with [facility]  Darya Bigler M. Milana Obey.D.

## 2012-06-09 NOTE — Anesthesia Postprocedure Evaluation (Signed)
Anesthesia Post Note  Patient: Kayla Chavez  Procedure(s) Performed: Procedure(s) (LRB): CESAREAN SECTION (N/A)  Anesthesia type: Spinal  Patient location: PACU  Post pain: Pain level controlled  Post assessment: Post-op Vital signs reviewed  Last Vitals:  Filed Vitals:   06/09/12 0252  BP: 120/77  Pulse: 86  Temp: 36.6 C  Resp: 18    Post vital signs: Reviewed  Level of consciousness: awake  Complications: No apparent anesthesia complications

## 2012-06-09 NOTE — Transfer of Care (Signed)
Immediate Anesthesia Transfer of Care Note  Patient: Kayla Chavez  Procedure(s) Performed: Procedure(s) (LRB) with comments: CESAREAN SECTION (N/A) - Primary edc 06/23/12  Patient Location: PACU  Anesthesia Type:Spinal  Level of Consciousness: awake, alert  and oriented  Airway & Oxygen Therapy: Patient Spontanous Breathing  Post-op Assessment: Report given to PACU RN and Post -op Vital signs reviewed and stable  Post vital signs: Reviewed and stable  Complications: No apparent anesthesia complications

## 2012-06-09 NOTE — Anesthesia Postprocedure Evaluation (Signed)
Anesthesia Post Note  Patient: Kayla Chavez  Procedure(s) Performed: Procedure(s) (LRB): CESAREAN SECTION (N/A)  Anesthesia type: Spinal  Patient location: PACU  Post pain: Pain level controlled  Post assessment: Post-op Vital signs reviewed  Last Vitals:  Filed Vitals:   06/09/12 0252  BP: 120/77  Pulse: 86  Temp: 36.6 C  Resp: 18    Post vital signs: Reviewed  Level of consciousness: awake  Complications: No apparent anesthesia complications  

## 2012-06-09 NOTE — Anesthesia Procedure Notes (Addendum)
Anesthesia Regional Block:   Narrative:    Spinal  Patient location during procedure: OR Start time: 06/09/2012 4:42 AM Staffing Anesthesiologist: Angus Seller., Harrell Gave. Performed by: anesthesiologist  Preanesthetic Checklist Completed: patient identified, site marked, surgical consent, pre-op evaluation, timeout performed, IV checked, risks and benefits discussed and monitors and equipment checked Spinal Block Patient position: sitting Prep: DuraPrep Patient monitoring: heart rate, cardiac monitor, continuous pulse ox and blood pressure Approach: midline Location: L3-4 Injection technique: single-shot Needle Needle type: Sprotte  Needle gauge: 24 G Needle length: 9 cm Assessment Sensory level: T4 Additional Notes Patient identified.  Risk benefits discussed including failed block, incomplete pain control, headache, nerve damage, paralysis, blood pressure changes, nausea, vomiting, reactions to medication both toxic or allergic, and postpartum back pain.  Patient expressed understanding and wished to proceed.  All questions were answered.  Sterile technique used throughout procedure.  CSF was clear.  No parasthesia or other complications.  Please see nursing notes for vital signs.

## 2012-06-09 NOTE — MAU Note (Signed)
Pt reports ROM @ 0030.

## 2012-06-10 ENCOUNTER — Encounter (HOSPITAL_COMMUNITY): Payer: Self-pay | Admitting: Obstetrics and Gynecology

## 2012-06-10 LAB — CBC
HCT: 30.5 % — ABNORMAL LOW (ref 36.0–46.0)
Hemoglobin: 10.5 g/dL — ABNORMAL LOW (ref 12.0–15.0)
MCV: 93.6 fL (ref 78.0–100.0)
RBC: 3.26 MIL/uL — ABNORMAL LOW (ref 3.87–5.11)
WBC: 10 10*3/uL (ref 4.0–10.5)

## 2012-06-10 NOTE — Progress Notes (Signed)
Subjective: Postpartum Day 1: Cesarean Delivery Patient reports tolerating PO, + flatus and no problems voiding.    Objective: Vital signs in last 24 hours: Temp:  [97.8 F (36.6 C)-98.8 F (37.1 C)] 97.8 F (36.6 C) (02/06 2311) Pulse Rate:  [64-76] 74  (02/06 2311) Resp:  [16-20] 18  (02/06 2311) BP: (86-113)/(51-72) 103/65 mmHg (02/06 2311) SpO2:  [97 %-98 %] 98 % (02/06 2311)  Physical Exam:  General: alert and cooperative Lochia: appropriate Uterine Fundus: firm Incision: honeycomb dressing CDI DVT Evaluation: No evidence of DVT seen on physical exam. Negative Homan's sign. No cords or calf tenderness. No significant calf/ankle edema.   Basename 06/10/12 0520 06/09/12 0255  HGB 10.5* 12.0  HCT 30.5* 34.1*    Assessment/Plan: Status post Cesarean section. Doing well postoperatively.  Continue current care.  Riane Rung G 06/10/2012, 8:11 AM

## 2012-06-10 NOTE — Progress Notes (Signed)

## 2012-06-11 MED ORDER — OXYCODONE-ACETAMINOPHEN 5-325 MG PO TABS: 1.0000 | ORAL_TABLET | Freq: Four times a day (QID) | ORAL | Status: DC | PRN

## 2012-06-11 MED ORDER — IBUPROFEN 800 MG PO TABS: 800.0000 mg | ORAL_TABLET | Freq: Three times a day (TID) | ORAL | Status: DC | PRN

## 2012-06-11 NOTE — Progress Notes (Signed)
Subjective: Postpartum Day 2: Cesarean Delivery Patient reports tolerating PO, + flatus and no problems voiding.    Objective: Vital signs in last 24 hours: Temp:  [97.9 F (36.6 C)-98.2 F (36.8 C)] 98.2 F (36.8 C) (02/08 0630) Pulse Rate:  [72-84] 84 (02/08 0630) Resp:  [18] 18 (02/08 0630) BP: (100-126)/(63-76) 100/67 mmHg (02/08 0630) SpO2:  [98 %-100 %] 98 % (02/08 0630)  Physical Exam:  General: alert, cooperative and appears stated age Lochia: appropriate Uterine Fundus: firm Incision: healing well, no significant drainage, no dehiscence, no significant erythema DVT Evaluation: No evidence of DVT seen on physical exam.   Recent Labs  06/09/12 0255 06/10/12 0520  HGB 12.0 10.5*  HCT 34.1* 30.5*    Assessment/Plan: Status post Cesarean section. Doing well postoperatively.  Discharge home with standard precautions and return to clinic in 1 week.  Zuzu Befort L 06/11/2012, 9:09 AM

## 2012-06-11 NOTE — Discharge Summary (Signed)
Obstetric Discharge Summary Reason for Admission: cesarean section Prenatal Procedures: none Intrapartum Procedures: cesarean: low cervical, transverse Postpartum Procedures: none Complications-Operative and Postpartum: none Hemoglobin  Date Value Range Status  06/10/2012 10.5* 12.0 - 15.0 g/dL Final     HCT  Date Value Range Status  06/10/2012 30.5* 36.0 - 46.0 % Final    Physical Exam:  General: alert, cooperative and appears stated age 27: appropriate Uterine Fundus: firm Incision: healing well, no significant drainage, no dehiscence, no significant erythema DVT Evaluation: No evidence of DVT seen on physical exam.  Discharge Diagnoses: Term Pregnancy-delivered  Discharge Information: Date: 06/11/2012 Activity: pelvic rest Diet: routine Medications: Ibuprofen and Percocet Condition: stable Instructions: refer to practice specific booklet Discharge to: home   Newborn Data: Live born female  Birth Weight: 6 lb 11.6 oz (3050 g) APGAR: 9, 9  Home with mother.  Jules Baty L 06/11/2012, 9:10 AM

## 2012-06-14 ENCOUNTER — Other Ambulatory Visit (HOSPITAL_COMMUNITY): Payer: BC Managed Care – PPO

## 2012-10-24 ENCOUNTER — Ambulatory Visit (INDEPENDENT_AMBULATORY_CARE_PROVIDER_SITE_OTHER): Payer: BC Managed Care – PPO | Admitting: Family Medicine

## 2012-10-24 ENCOUNTER — Encounter: Payer: Self-pay | Admitting: Family Medicine

## 2012-10-24 VITALS — BP 118/64 | HR 68 | Temp 98.3°F | Wt 137.0 lb

## 2012-10-24 DIAGNOSIS — J069 Acute upper respiratory infection, unspecified: Secondary | ICD-10-CM | POA: Insufficient documentation

## 2012-10-24 MED ORDER — AMOXICILLIN 875 MG PO TABS: 875.0000 mg | ORAL_TABLET | Freq: Two times a day (BID) | ORAL | Status: DC

## 2012-10-24 NOTE — Progress Notes (Signed)
SUBJECTIVE:  Kayla Chavez is a 27 y.o. female who is [redacted] weeks pregnant who complains of coryza, congestion and sore throat for 3 days. She denies a history of anorexia and chest pain and denies a history of asthma. Patient denies smoke cigarettes.  She is not breast feeding - has a 4 month old son.  Patient Active Problem List   Diagnosis Date Noted  . Acute upper respiratory infections of unspecified site 10/24/2012  . Vaginal bleeding in pregnancy 04/08/2012  . Mucocele of lower lip 09/30/2011  . URI (upper respiratory infection) 08/02/2011  . Abdominal  pain, other specified site 07/12/2011  . Gynecological examination 01/23/2011  . Abnormal TSH 01/23/2011  . Routine general medical examination at a health care facility 01/07/2011  . Screen for STD (sexually transmitted disease) 01/07/2011  . LEUKOPENIA, MILD 01/01/2010  . BIPOLAR AFFECTIVE DISORDER 01/01/2010  . ACNE VULGARIS, FACIAL 01/01/2010  . 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 FATIGUE 11/29/2007  . ASCUS PAP 10/06/2007  . G E R D 12/14/2006  . EPISTAXIS 12/13/2006   Past Medical History  Diagnosis Date  . Abnormal Pap smear of cervix   . GERD (gastroesophageal reflux disease)   . Palpitations   . Stress reaction   . HSV-2 infection   . Complication of anesthesia     during wisdom teeth extraction, experience tachycardia with anesthesia    Past Surgical History  Procedure Laterality Date  . Tonsillectomy    . Knee surgery    . Wisdom tooth extraction    . Cesarean section  06/09/2012    Procedure: CESAREAN SECTION;  Surgeon: Meriel Pica, MD;  Location: [facility];  Service: Obstetrics;  Laterality: N/A;  Primary Cesarean Section Delivery Baby Boy @ [phone], Apgars  9/9   History  Substance Use Topics  . Smoking status: Never Smoker   . Smokeless tobacco: Not on file  . Alcohol Use: Yes   Family History  Problem Relation Age of Onset  . Hypertension Father   . Hyperlipidemia Father   . Leukemia Paternal  Uncle   . Cancer Paternal Grandfather     prostate  . Alcohol abuse Paternal Grandfather   . Hyperlipidemia Paternal Grandfather    Allergies  Allergen Reactions  . Naproxen Diarrhea  . Ortho Tri-Cyclen (Norgestimate-Eth Estradiol)     Heavy menses  . Other     STEROIDS:  Causes upset stomach/severe cramps/mood swings  . Duricef (Cefadroxil) Hives and Rash   Current Outpatient Prescriptions on File Prior to Visit  Medication Sig Dispense Refill  . lamoTRIgine (LAMICTAL) 150 MG tablet Take 150 mg by mouth at bedtime.       . Prenatal Vit-Fe Fumarate-FA (PRENATAL MULTIVITAMIN) TABS Take 1 tablet by mouth at bedtime.      . [DISCONTINUED] drospirenone-ethinyl estradiol (YAZ) 3-0.02 MG tablet Take 1 tablet by mouth daily.  3 Package  3   No current facility-administered medications on file prior to visit.   The PMH, PSH, Social History, Family History, Medications, and allergies have been reviewed in [facility], and have been updated if relevant.    OBJECTIVE: BP 118/64  Pulse 68  Temp(Src) 98.3 F (36.8 C)  Wt 137 lb (62.143 kg)  BMI 22.8 kg/m2  LMP 09/17/2011  Breastfeeding? No  She appears well, vital signs are as noted. Ears normal.  +pharyngeal erythema, no exudate  Neck supple. No adenopathy in the neck. Nose is congested. Sinuses non tender. Scattered, faint exp wheezes, right lower lung  ASSESSMENT:  sinusitis and bronchitis  PLAN: Will treat with Amoxicillin (states she can take despite cephalosporin allergy) given wheezes on exam. Symptomatic therapy suggested: push fluids, rest and return office visit prn if symptoms persist or worsen. Given list of meds safer to take during pregnancy Call or return to clinic prn if these symptoms worsen or fail to improve as anticipated.

## 2012-10-24 NOTE — Addendum Note (Signed)
Addended by: Eliezer Bottom on: 10/24/2012 12:35 PM   Modules accepted: Orders

## 2012-10-24 NOTE — Patient Instructions (Signed)
Good to see you. Your rapid strep test was negative for strep throat.  We will call you with your culture results to confirm this.  Take amoxicillin as directed.  Ok to take Tylenol, Zyrtec/Benadryl/claritin, mucinex, and cough drops as needed but better as little as you can since you are in the first trimester.

## 2012-10-25 ENCOUNTER — Encounter (HOSPITAL_COMMUNITY): Payer: Self-pay | Admitting: Emergency Medicine

## 2012-10-25 DIAGNOSIS — J209 Acute bronchitis, unspecified: Secondary | ICD-10-CM | POA: Insufficient documentation

## 2012-10-25 DIAGNOSIS — Z8679 Personal history of other diseases of the circulatory system: Secondary | ICD-10-CM | POA: Insufficient documentation

## 2012-10-25 DIAGNOSIS — O99891 Other specified diseases and conditions complicating pregnancy: Secondary | ICD-10-CM | POA: Insufficient documentation

## 2012-10-25 DIAGNOSIS — Z8619 Personal history of other infectious and parasitic diseases: Secondary | ICD-10-CM | POA: Insufficient documentation

## 2012-10-25 DIAGNOSIS — Z8719 Personal history of other diseases of the digestive system: Secondary | ICD-10-CM | POA: Insufficient documentation

## 2012-10-25 DIAGNOSIS — J069 Acute upper respiratory infection, unspecified: Secondary | ICD-10-CM | POA: Insufficient documentation

## 2012-10-25 DIAGNOSIS — Z79899 Other long term (current) drug therapy: Secondary | ICD-10-CM | POA: Insufficient documentation

## 2012-10-25 DIAGNOSIS — O99340 Other mental disorders complicating pregnancy, unspecified trimester: Secondary | ICD-10-CM | POA: Insufficient documentation

## 2012-10-25 DIAGNOSIS — Z792 Long term (current) use of antibiotics: Secondary | ICD-10-CM | POA: Insufficient documentation

## 2012-10-25 DIAGNOSIS — F319 Bipolar disorder, unspecified: Secondary | ICD-10-CM | POA: Insufficient documentation

## 2012-10-25 NOTE — ED Notes (Signed)
PT. WAS DIAGNOSED WITH BRONCHITIS BY PCP THIS Monday PRESCRIBED WITH AMOXICILLIN AND MUCINEX WITH NO IMPROVEMENT , PT. REPORTS CHEST CONGESTION , DRY COUGH AND NASAL CONGESTION . PT. IS [redacted] WEEKS PREGNANT ( G2P1) .

## 2012-10-26 ENCOUNTER — Emergency Department (HOSPITAL_COMMUNITY): Payer: BC Managed Care – PPO

## 2012-10-26 ENCOUNTER — Telehealth: Payer: Self-pay

## 2012-10-26 ENCOUNTER — Emergency Department (HOSPITAL_COMMUNITY)
Admission: EM | Admit: 2012-10-26 | Discharge: 2012-10-26 | Disposition: A | Discharge status: Home / Self Care | Payer: BC Managed Care – PPO | Attending: Emergency Medicine | Admitting: Emergency Medicine

## 2012-10-26 DIAGNOSIS — J069 Acute upper respiratory infection, unspecified: Secondary | ICD-10-CM

## 2012-10-26 DIAGNOSIS — J209 Acute bronchitis, unspecified: Secondary | ICD-10-CM

## 2012-10-26 HISTORY — DX: Bipolar disorder, unspecified: F31.9

## 2012-10-26 HISTORY — DX: Bronchitis, not specified as acute or chronic: J40

## 2012-10-26 LAB — CULTURE, GROUP A STREP: Organism ID, Bacteria: NORMAL

## 2012-10-26 MED ORDER — ALBUTEROL SULFATE HFA 108 (90 BASE) MCG/ACT IN AERS: 2.0000 | INHALATION_SPRAY | RESPIRATORY_TRACT | Status: DC | PRN

## 2012-10-26 MED ORDER — ALBUTEROL SULFATE (5 MG/ML) 0.5% IN NEBU
2.5000 mg | INHALATION_SOLUTION | Freq: Once | RESPIRATORY_TRACT | Status: AC
  Administered 2012-10-26: 2.5 mg via RESPIRATORY_TRACT
  Filled 2012-10-26: qty 0.5

## 2012-10-26 MED ORDER — IPRATROPIUM BROMIDE 0.02 % IN SOLN
0.5000 mg | Freq: Once | RESPIRATORY_TRACT | Status: AC
  Administered 2012-10-26: 0.5 mg via RESPIRATORY_TRACT
  Filled 2012-10-26: qty 2.5

## 2012-10-26 NOTE — Telephone Encounter (Signed)
Spoke with patient. She actually went to ER [facility]) for chest tightness and was given nebulizer treatment. She was seen here Monday for bronchitis and was just having the tightness last night and decided to go on to the ER. She is feeling better today and appreciated the call.

## 2012-10-26 NOTE — Telephone Encounter (Signed)
plz call to f/u on patient - looks like she called [facility] but they were unable to get in touch with her - may leave message.

## 2012-10-26 NOTE — ED Provider Notes (Signed)
History    CSN: [id number] Arrival date & time 10/25/12  2159  First MD Initiated Contact with Patient 10/26/12 0056     Chief Complaint  Patient presents with  . Bronchitis   (Consider location/radiation/quality/duration/timing/severity/associated sxs/prior Treatment) The history is provided by the patient.   27 year old female who is [redacted] weeks pregnant comes in with a 2 day history of subjective fever, chills, sweats with associated sore throat, nasal congestion, and dry cough. She complains of a tight feeling in her chest. There's been ongoing nausea pregnancy but no change in the baseline nausea. She denies arthralgias or myalgias. She saw her PCP 2 days ago who did a strep screen which was negative and started her on amoxicillin. Since then, and her symptoms have been getting worse. Past Medical History  Diagnosis Date  . Abnormal Pap smear of cervix   . GERD (gastroesophageal reflux disease)   . Palpitations   . Stress reaction   . HSV-2 infection   . Complication of anesthesia     during wisdom teeth extraction, experience tachycardia with anesthesia   . Bipolar 1 disorder   . Bronchitis    Past Surgical History  Procedure Laterality Date  . Tonsillectomy    . Knee surgery    . Wisdom tooth extraction    . Cesarean section  06/09/2012    Procedure: CESAREAN SECTION;  Surgeon: Meriel Pica, MD;  Location: [facility];  Service: Obstetrics;  Laterality: N/A;  Primary Cesarean Section Delivery Baby Boy @ [phone], Apgars  9/9   Family History  Problem Relation Age of Onset  . Hypertension Father   . Hyperlipidemia Father   . Leukemia Paternal Uncle   . Cancer Paternal Grandfather     prostate  . Alcohol abuse Paternal Grandfather   . Hyperlipidemia Paternal Grandfather    History  Substance Use Topics  . Smoking status: Never Smoker   . Smokeless tobacco: Not on file  . Alcohol Use: Yes   OB History   Grav Para Term Preterm Abortions TAB SAB Ect Mult Living   2 1 1  0  0 0 0 0 0 1     Review of Systems  All other systems reviewed and are negative.    Allergies  Naproxen; Ortho tri-cyclen; Other; and Duricef  Home Medications   Current Outpatient Rx  Name  Route  Sig  Dispense  Refill  . amoxicillin (AMOXIL) 875 MG tablet   Oral   Take 875 mg by mouth 2 (two) times daily. 10 day course filled 10/24/12         . Ascorbic Acid (VITAMIN C DROPS MT)   Mouth/Throat   Use as directed 2 lozenges in the mouth or throat daily.         . folic acid (FOLVITE) 1 MG tablet   Oral   Take 1 mg by mouth 2 (two) times daily.          Marland Kitchen guaiFENesin (MUCINEX) 600 MG 12 hr tablet   Oral   Take 1,200 mg by mouth every 12 (twelve) hours.         Marland Kitchen lamoTRIgine (LAMICTAL) 150 MG tablet   Oral   Take 150 mg by mouth at bedtime.          . Prenatal Vit-Fe Fumarate-FA (PRENATAL MULTIVITAMIN) TABS   Oral   Take 1 tablet by mouth at bedtime.          BP 121/66  Pulse 114  Temp(Src) 99.1  F (37.3 C) (Oral)  Resp 14  SpO2 99%  LMP 09/17/2011 Physical Exam  Nursing note and vitals reviewed.  27 year old female, resting comfortably and in no acute distress. Vital signs are significant for tachycardia with heart rate 114. Oxygen saturation is 99%, which is normal. Head is normocephalic and atraumatic. PERRLA, EOMI. Oropharynx is clear. Neck is nontender and supple without adenopathy or JVD. Back is nontender and there is no CVA tenderness. Lungs are clear without rales, wheezes, or rhonchi. No wheezes heard even with forced exhalation. Chest is nontender. Heart has regular rate and rhythm without murmur. Abdomen is soft, flat, nontender without masses or hepatosplenomegaly and peristalsis is normoactive. Extremities have no cyanosis or edema, full range of motion is present. Skin is warm and dry without rash. Neurologic: Mental status is normal, cranial nerves are intact, there are no motor or sensory deficits.  ED Course  Procedures  (including critical care time) Dg Chest 2 View  10/26/2012   *RADIOLOGY REPORT*  Clinical Data: Cough, bronchitis.  CHEST - 2 VIEW  Comparison: None.  Findings: Heart and mediastinal contours are within normal limits. No focal opacities or effusions.  No acute bony abnormality.  IMPRESSION: No active cardiopulmonary disease.   Original Report Authenticated By: Charlett Nose, M.D.   1. Acute upper respiratory infections of unspecified site   2. Acute bronchitis     MDM  Respiratory tract infection with bronchitis. Old records are reviewed and she was seen in the office 2 days ago and started on amoxicillin. Exam at that time showed some faint wheezes. Chest x-ray will be obtained to rule out pneumonia and she'll be given a breathing treatment with albuterol and ipratropium.  2:21 AM She had partial relief of cough and chest tightness with breathing treatment. On exam, lungs are clear. She is discharged with prescriptions for albuterol inhaler and is advised to use Afrin nasal spray or Neo-Synephrine nasal spray as needed for nasal congestion with the provision that she not use it for more than 3 days.  Dione Booze, MD 10/26/12 [phone]

## 2012-10-26 NOTE — Telephone Encounter (Signed)
Confidential Office Message [address] [place], [place] [address] p. [phone] f. [phone] To: [facility] (After Hours Triage) Fax: [phone] From: [facility] Date/ Time: 10/25/2012 8:53 PM Taken By: Donnella Sham, RN Caller: Valli Glance Facility: Not Collected Patient: Kayla Chavez, Kayla Chavez DOB: 09/09/1985 Phone: [phone] Reason for Call: Caller was unable to be reached on callback - Left Message Regarding Appointment: No Appt Date: Appt Time: Unknown Provider: Reason: Details: Outcome: Confidential

## 2012-10-26 NOTE — Telephone Encounter (Signed)
[facility] (After Hours Triage) Fax: [phone] From: [facility] Date/ Time: 10/25/2012 8:53 PM Taken By: Donnella Sham, RN Caller: Valli Glance Facility: Not Collected Patient: Kayla, Chavez DOB: 06-Jan-1986 Phone: [phone] Reason for Call: Caller was unable to be reached on callback - Left Message Regarding Appointment: No Appt Date: Appt Time: Unknown Provider: Reason: Details: Outcome:

## 2012-11-30 ENCOUNTER — Inpatient Hospital Stay (HOSPITAL_COMMUNITY)
Admission: AD | Admit: 2012-11-30 | Discharge: 2012-11-30 | DRG: 379 | Disposition: A | Discharge status: Home / Self Care | Payer: BC Managed Care – PPO | Source: Ambulatory Visit | Attending: Obstetrics and Gynecology | Admitting: Obstetrics and Gynecology

## 2012-11-30 ENCOUNTER — Inpatient Hospital Stay (HOSPITAL_COMMUNITY): Payer: BC Managed Care – PPO

## 2012-11-30 ENCOUNTER — Encounter (HOSPITAL_COMMUNITY): Payer: Self-pay

## 2012-11-30 DIAGNOSIS — O209 Hemorrhage in early pregnancy, unspecified: Principal | ICD-10-CM | POA: Diagnosis present

## 2012-11-30 DIAGNOSIS — O418X21 Other specified disorders of amniotic fluid and membranes, second trimester, fetus 1: Secondary | ICD-10-CM

## 2012-11-30 DIAGNOSIS — O368990 Maternal care for other specified fetal problems, unspecified trimester, not applicable or unspecified: Secondary | ICD-10-CM

## 2012-11-30 LAB — CBC
Hemoglobin: 12.3 g/dL (ref 12.0–15.0)
RBC: 3.95 MIL/uL (ref 3.87–5.11)
WBC: 7.3 10*3/uL (ref 4.0–10.5)

## 2012-11-30 LAB — ABO/RH: ABO/RH(D): O POS

## 2012-11-30 LAB — HCG, QUANTITATIVE, PREGNANCY: hCG, Beta Chain, Quant, S: 33667 m[IU]/mL — ABNORMAL HIGH (ref <5)

## 2012-11-30 NOTE — MAU Provider Note (Signed)
History     CSN: [id number]  Arrival date and time: 11/30/12 0865   First Provider Initiated Contact with Patient 11/30/12 [phone]      Chief Complaint  Patient presents with  . Vaginal Bleeding   Vaginal Bleeding    Kayla Chavez is a 27 y.o. G2P1001 at 12 weeks who presents today with vaginal bleeding. She states that about an hour ago she awoke and felt a warm gush. She saw it was blood. She states that it has since slowed down, and is only spotting now. She denies any pain.   Past Medical History  Diagnosis Date  . Abnormal Pap smear of cervix   . GERD (gastroesophageal reflux disease)   . Palpitations   . Stress reaction   . HSV-2 infection   . Complication of anesthesia     during wisdom teeth extraction, experience tachycardia with anesthesia   . Bipolar 1 disorder   . Bronchitis     Past Surgical History  Procedure Laterality Date  . Tonsillectomy    . Knee surgery    . Wisdom tooth extraction    . Cesarean section  06/09/2012    Procedure: CESAREAN SECTION;  Surgeon: Meriel Pica, MD;  Location: [facility];  Service: Obstetrics;  Laterality: N/A;  Primary Cesarean Section Delivery Baby Boy @ [phone], Apgars  9/9    Family History  Problem Relation Age of Onset  . Hypertension Father   . Hyperlipidemia Father   . Leukemia Paternal Uncle   . Cancer Paternal Grandfather     prostate  . Alcohol abuse Paternal Grandfather   . Hyperlipidemia Paternal Grandfather     History  Substance Use Topics  . Smoking status: Never Chavez   . Smokeless tobacco: Not on file  . Alcohol Use: Yes    Allergies:  Allergies  Allergen Reactions  . Naproxen Diarrhea  . Ortho Tri-Cyclen (Norgestimate-Eth Estradiol)     Heavy menses  . Other     STEROIDS:  Causes upset stomach/severe cramps/mood swings  . Duricef (Cefadroxil) Hives and Rash    Prescriptions prior to admission  Medication Sig Dispense Refill  . Ascorbic Acid (VITAMIN C DROPS MT) Use as directed 2  lozenges in the mouth or throat daily.      . folic acid (FOLVITE) 1 MG tablet Take 1 mg by mouth 2 (two) times daily.       Marland Kitchen lamoTRIgine (LAMICTAL) 150 MG tablet Take 150 mg by mouth at bedtime.       . Prenatal Vit-Fe Fumarate-FA (PRENATAL MULTIVITAMIN) TABS Take 1 tablet by mouth at bedtime.      Marland Kitchen albuterol (PROVENTIL HFA;VENTOLIN HFA) 108 (90 BASE) MCG/ACT inhaler Inhale 2 puffs into the lungs every 4 (four) hours as needed for wheezing or shortness of breath (or coughing or tightness in the chest).  1 Inhaler  0  . amoxicillin (AMOXIL) 875 MG tablet Take 875 mg by mouth 2 (two) times daily. 10 day course filled 10/24/12      . guaiFENesin (MUCINEX) 600 MG 12 hr tablet Take 1,200 mg by mouth every 12 (twelve) hours.        Review of Systems  Genitourinary: Positive for vaginal bleeding.   Physical Exam   Blood pressure 110/58, pulse 92, temperature 97.4 F (36.3 C), temperature source Oral, resp. rate 18, last menstrual period 09/17/2011, SpO2 100.00%.  Physical Exam  Nursing note and vitals reviewed. Constitutional: She is oriented to person, place, and time. She appears well-developed  and well-nourished. No distress.  Cardiovascular: Normal rate.   Respiratory: Effort normal.  GI: Soft. There is no tenderness.  Genitourinary:   External: no lesion Vagina: small amount of pink/brown Cervix: pink, smooth, no CMT, closed Uterus: 12-13 weeks size, FHT 160 Adnexa: NT   Neurological: She is alert and oriented to person, place, and time.  Skin: Skin is warm and dry.  Psychiatric: She has a normal mood and affect.    MAU Course  Procedures  Results for orders placed during the hospital encounter of 11/30/12 (from the past 24 hour(s))  CBC     Status: Abnormal   Collection Time    11/30/12  3:00 AM      Result Value Range   WBC 7.3  4.0 - 10.5 K/uL   RBC 3.95  3.87 - 5.11 MIL/uL   Hemoglobin 12.3  12.0 - 15.0 g/dL   HCT 13.0 (*) 86.5 - 78.4 %   MCV 89.1  78.0 - 100.0 fL    MCH 31.1  26.0 - 34.0 pg   MCHC 34.9  30.0 - 36.0 g/dL   RDW 69.6  29.5 - 28.4 %   Platelets 179  150 - 400 K/uL  ABO/RH     Status: None   Collection Time    11/30/12  3:00 AM      Result Value Range   ABO/RH(D) O POS    HCG, QUANTITATIVE, PREGNANCY     Status: Abnormal   Collection Time    11/30/12  3:00 AM      Result Value Range   hCG, Beta Nyra Jabs, [place] [address] (*) <5 mIU/mL   US Ob Comp Less 14 Wks  11/30/2012   *RADIOLOGY REPORT*  Clinical Data: Pregnancy.  Bleeding.  Quantitative beta HCG 33,000.  OBSTETRIC <14 WK ULTRASOUND  Technique:  Transabdominal ultrasound was performed for evaluation of the gestation as well as the maternal uterus and adnexal regions.  Comparison:  None.  Intrauterine gestational sac: Visualized/normal in shape. Yolk sac: Present Embryo: Present Cardiac Activity: Present Heart Rate: 149 bpm CRL:  52.5 mm  12 w  0 d            [place] EDC: 06/14/2013  Maternal uterus/Adnexae: Small subchorionic hemorrhage is present at the internal os. Physiologic appearance of the right ovary with a corpus luteum cyst measuring 14 mm.  The left ovary is not seen.  IMPRESSION: Bone, single intrauterine pregnancy with fetal heart tones of 149 beats per minute.  Small subchorionic hemorrhage at the internal os.   Original Report Authenticated By: Andreas Newport, M.D.    D/W Dr. Vincente Poli, will send for [place] and continue to monitor bleeding before sending home.  Assessment and Plan   1. Subchorionic hematoma, antepartum, second trimester, fetus 1    Bleeding precautions reviewed FU with Dr. Marcelle Overlie as planned Return to MAU as needed   Tawnya Crook 11/30/2012, 4:18 AM

## 2012-11-30 NOTE — MAU Note (Signed)
Pt states woke up at 0130 with vaginal bleeding. Denies cramping or pain.

## 2013-05-20 LAB — OB RESULTS CONSOLE GBS: STREP GROUP B AG: POSITIVE

## 2013-05-28 ENCOUNTER — Encounter (HOSPITAL_COMMUNITY): Payer: Self-pay | Admitting: *Deleted

## 2013-05-28 ENCOUNTER — Inpatient Hospital Stay (HOSPITAL_COMMUNITY)
Admission: AD | Admit: 2013-05-28 | Discharge: 2013-05-28 | Disposition: A | Discharge status: Home / Self Care | Payer: BC Managed Care – PPO | Source: Ambulatory Visit | Attending: Obstetrics and Gynecology | Admitting: Obstetrics and Gynecology

## 2013-05-28 DIAGNOSIS — O479 False labor, unspecified: Secondary | ICD-10-CM | POA: Insufficient documentation

## 2013-05-28 DIAGNOSIS — R197 Diarrhea, unspecified: Secondary | ICD-10-CM | POA: Insufficient documentation

## 2013-05-28 DIAGNOSIS — O368190 Decreased fetal movements, unspecified trimester, not applicable or unspecified: Secondary | ICD-10-CM | POA: Insufficient documentation

## 2013-05-28 DIAGNOSIS — O212 Late vomiting of pregnancy: Secondary | ICD-10-CM | POA: Insufficient documentation

## 2013-05-28 LAB — URINALYSIS, ROUTINE W REFLEX MICROSCOPIC
Bilirubin Urine: NEGATIVE
GLUCOSE, UA: NEGATIVE mg/dL
Hgb urine dipstick: NEGATIVE
Ketones, ur: 15 mg/dL — AB
Nitrite: NEGATIVE
PROTEIN: NEGATIVE mg/dL
SPECIFIC GRAVITY, URINE: 1.01 (ref 1.005–1.030)
Urobilinogen, UA: 0.2 mg/dL (ref 0.0–1.0)
pH: 7.5 (ref 5.0–8.0)

## 2013-05-28 LAB — URINE MICROSCOPIC-ADD ON

## 2013-05-28 MED ORDER — BUTORPHANOL TARTRATE 1 MG/ML IJ SOLN: 1.0000 mg | Freq: Once | INTRAMUSCULAR | Status: DC

## 2013-05-28 MED ORDER — PROMETHAZINE HCL 25 MG/ML IJ SOLN
25.0000 mg | Freq: Once | INTRAMUSCULAR | Status: AC
  Administered 2013-05-28: 25 mg via INTRAVENOUS
  Filled 2013-05-28: qty 1

## 2013-05-28 NOTE — MAU Note (Signed)
Patient do not want the Stadol at this time. States the pain is more annoying than anything and will let me know if she changes her mind.

## 2013-05-28 NOTE — MAU Note (Signed)
Vomited x 1 and diarrhea since 2400. Baby has not moved a lot since n/v/d started. Have had contractions for last wk. Stronger this evening

## 2013-05-28 NOTE — Discharge Instructions (Signed)

## 2013-05-31 ENCOUNTER — Inpatient Hospital Stay (HOSPITAL_COMMUNITY)
Admission: AD | Admit: 2013-05-31 | Discharge: 2013-06-03 | DRG: 766 | Disposition: A | Discharge status: Home / Self Care | Payer: BC Managed Care – PPO | Source: Ambulatory Visit | Attending: Obstetrics and Gynecology | Admitting: Obstetrics and Gynecology

## 2013-05-31 ENCOUNTER — Inpatient Hospital Stay (HOSPITAL_COMMUNITY): Payer: Self-pay | Admitting: *Deleted

## 2013-05-31 DIAGNOSIS — K219 Gastro-esophageal reflux disease without esophagitis: Secondary | ICD-10-CM

## 2013-05-31 DIAGNOSIS — K130 Diseases of lips: Secondary | ICD-10-CM

## 2013-05-31 DIAGNOSIS — O99344 Other mental disorders complicating childbirth: Secondary | ICD-10-CM | POA: Diagnosis present

## 2013-05-31 DIAGNOSIS — Z302 Encounter for sterilization: Secondary | ICD-10-CM

## 2013-05-31 DIAGNOSIS — O4690 Antepartum hemorrhage, unspecified, unspecified trimester: Secondary | ICD-10-CM

## 2013-05-31 DIAGNOSIS — O9989 Other specified diseases and conditions complicating pregnancy, childbirth and the puerperium: Secondary | ICD-10-CM

## 2013-05-31 DIAGNOSIS — A6004 Herpesviral vulvovaginitis: Secondary | ICD-10-CM

## 2013-05-31 DIAGNOSIS — F319 Bipolar disorder, unspecified: Secondary | ICD-10-CM

## 2013-05-31 DIAGNOSIS — O34219 Maternal care for unspecified type scar from previous cesarean delivery: Principal | ICD-10-CM | POA: Diagnosis present

## 2013-05-31 DIAGNOSIS — K1379 Other lesions of oral mucosa: Secondary | ICD-10-CM

## 2013-05-31 DIAGNOSIS — R5383 Other fatigue: Secondary | ICD-10-CM

## 2013-05-31 DIAGNOSIS — R87610 Atypical squamous cells of undetermined significance on cytologic smear of cervix (ASC-US): Secondary | ICD-10-CM

## 2013-05-31 DIAGNOSIS — R5381 Other malaise: Secondary | ICD-10-CM

## 2013-05-31 DIAGNOSIS — L708 Other acne: Secondary | ICD-10-CM

## 2013-05-31 DIAGNOSIS — J069 Acute upper respiratory infection, unspecified: Secondary | ICD-10-CM

## 2013-05-31 DIAGNOSIS — R109 Unspecified abdominal pain: Secondary | ICD-10-CM

## 2013-05-31 DIAGNOSIS — Z22330 Carrier of Group B streptococcus: Secondary | ICD-10-CM

## 2013-05-31 DIAGNOSIS — Z113 Encounter for screening for infections with a predominantly sexual mode of transmission: Secondary | ICD-10-CM

## 2013-05-31 DIAGNOSIS — R040 Epistaxis: Secondary | ICD-10-CM

## 2013-05-31 DIAGNOSIS — R7989 Other specified abnormal findings of blood chemistry: Secondary | ICD-10-CM

## 2013-05-31 DIAGNOSIS — O99892 Other specified diseases and conditions complicating childbirth: Secondary | ICD-10-CM | POA: Diagnosis present

## 2013-05-31 DIAGNOSIS — D72819 Decreased white blood cell count, unspecified: Secondary | ICD-10-CM

## 2013-05-31 DIAGNOSIS — Z0000 Encounter for general adult medical examination without abnormal findings: Secondary | ICD-10-CM

## 2013-05-31 MED ORDER — LACTATED RINGERS IV SOLN
INTRAVENOUS | Status: DC
  Administered 2013-06-01 (×2): via INTRAVENOUS

## 2013-05-31 NOTE — MAU Note (Signed)
Pt G2 P1 at 37.6wks, previous C/S, plans for C/S. Seen in the office today SVE 3/100%, started having contractions and bloody show after office visit.

## 2013-06-01 ENCOUNTER — Encounter (HOSPITAL_COMMUNITY): Payer: Self-pay | Admitting: Anesthesiology

## 2013-06-01 ENCOUNTER — Inpatient Hospital Stay (HOSPITAL_COMMUNITY): Payer: BC Managed Care – PPO | Admitting: Anesthesiology

## 2013-06-01 ENCOUNTER — Encounter (HOSPITAL_COMMUNITY)
Admission: AD | Disposition: A | Discharge status: Home / Self Care | Payer: Self-pay | Source: Ambulatory Visit | Attending: Obstetrics and Gynecology

## 2013-06-01 ENCOUNTER — Encounter (HOSPITAL_COMMUNITY): Payer: BC Managed Care – PPO | Admitting: Anesthesiology

## 2013-06-01 LAB — CBC
HEMATOCRIT: 32.1 % — AB (ref 36.0–46.0)
HEMATOCRIT: 35.1 % — AB (ref 36.0–46.0)
HEMOGLOBIN: 11.2 g/dL — AB (ref 12.0–15.0)
Hemoglobin: 12.6 g/dL (ref 12.0–15.0)
MCH: 31.8 pg (ref 26.0–34.0)
MCH: 32.4 pg (ref 26.0–34.0)
MCHC: 34.9 g/dL (ref 30.0–36.0)
MCHC: 35.9 g/dL (ref 30.0–36.0)
MCV: 90.2 fL (ref 78.0–100.0)
MCV: 91.2 fL (ref 78.0–100.0)
Platelets: 127 10*3/uL — ABNORMAL LOW (ref 150–400)
Platelets: 157 10*3/uL (ref 150–400)
RBC: 3.52 MIL/uL — ABNORMAL LOW (ref 3.87–5.11)
RBC: 3.89 MIL/uL (ref 3.87–5.11)
RDW: 12.8 % (ref 11.5–15.5)
RDW: 12.9 % (ref 11.5–15.5)
WBC: 10.4 10*3/uL (ref 4.0–10.5)
WBC: 9.2 10*3/uL (ref 4.0–10.5)

## 2013-06-01 LAB — RPR: RPR Ser Ql: NONREACTIVE

## 2013-06-01 LAB — TYPE AND SCREEN
ABO/RH(D): O POS
Antibody Screen: NEGATIVE

## 2013-06-01 SURGERY — Surgical Case
Anesthesia: Spinal | Site: Abdomen

## 2013-06-01 MED ORDER — PROMETHAZINE HCL 25 MG/ML IJ SOLN: 6.2500 mg | INTRAMUSCULAR | Status: DC | PRN

## 2013-06-01 MED ORDER — MORPHINE SULFATE (PF) 0.5 MG/ML IJ SOLN
INTRAMUSCULAR | Status: DC | PRN
  Administered 2013-06-01: .1 mg via INTRATHECAL

## 2013-06-01 MED ORDER — OXYCODONE-ACETAMINOPHEN 5-325 MG PO TABS
1.0000 | ORAL_TABLET | Freq: Four times a day (QID) | ORAL | Status: DC | PRN
  Administered 2013-06-01 – 2013-06-02 (×2): 1 via ORAL
  Administered 2013-06-02: 2 via ORAL
  Filled 2013-06-01 (×2): qty 1
  Filled 2013-06-01: qty 2

## 2013-06-01 MED ORDER — DIPHENHYDRAMINE HCL 50 MG/ML IJ SOLN
25.0000 mg | INTRAMUSCULAR | Status: DC | PRN
Start: 2013-06-01 — End: 2013-06-03

## 2013-06-01 MED ORDER — SIMETHICONE 80 MG PO CHEW
80.0000 mg | CHEWABLE_TABLET | Freq: Three times a day (TID) | ORAL | Status: DC
  Administered 2013-06-01 – 2013-06-03 (×6): 80 mg via ORAL
  Filled 2013-06-01 (×6): qty 1

## 2013-06-01 MED ORDER — ZOLPIDEM TARTRATE 5 MG PO TABS: 5.0000 mg | ORAL_TABLET | Freq: Every evening | ORAL | Status: DC | PRN

## 2013-06-01 MED ORDER — OXYTOCIN 40 UNITS IN LACTATED RINGERS INFUSION - SIMPLE MED: 62.5000 mL/h | INTRAVENOUS | Status: AC

## 2013-06-01 MED ORDER — SODIUM CHLORIDE 0.9 % IJ SOLN: 3.0000 mL | Freq: Two times a day (BID) | INTRAMUSCULAR | Status: DC

## 2013-06-01 MED ORDER — MEPERIDINE HCL 25 MG/ML IJ SOLN: 6.2500 mg | INTRAMUSCULAR | Status: DC | PRN

## 2013-06-01 MED ORDER — MORPHINE SULFATE 0.5 MG/ML IJ SOLN
INTRAMUSCULAR | Status: AC
  Filled 2013-06-01: qty 10

## 2013-06-01 MED ORDER — DIPHENHYDRAMINE HCL 25 MG PO CAPS: 25.0000 mg | ORAL_CAPSULE | ORAL | Status: DC | PRN

## 2013-06-01 MED ORDER — KETOROLAC TROMETHAMINE 30 MG/ML IJ SOLN: 30.0000 mg | Freq: Four times a day (QID) | INTRAMUSCULAR | Status: DC | PRN

## 2013-06-01 MED ORDER — SODIUM CHLORIDE 0.9 % IV SOLN: 250.0000 mL | INTRAVENOUS | Status: DC

## 2013-06-01 MED ORDER — FENTANYL CITRATE 0.05 MG/ML IJ SOLN
INTRAMUSCULAR | Status: AC
  Filled 2013-06-01: qty 2

## 2013-06-01 MED ORDER — BUPIVACAINE IN DEXTROSE 0.75-8.25 % IT SOLN
INTRATHECAL | Status: DC | PRN
  Administered 2013-06-01: 1.2 mL via INTRATHECAL

## 2013-06-01 MED ORDER — ONDANSETRON HCL 4 MG PO TABS: 4.0000 mg | ORAL_TABLET | ORAL | Status: DC | PRN

## 2013-06-01 MED ORDER — PHENYLEPHRINE 8 MG IN D5W 100 ML (0.08MG/ML) PREMIX OPTIME
INJECTION | INTRAVENOUS | Status: AC
  Filled 2013-06-01: qty 100

## 2013-06-01 MED ORDER — HYDROMORPHONE HCL PF 1 MG/ML IJ SOLN: 0.2500 mg | INTRAMUSCULAR | Status: DC | PRN

## 2013-06-01 MED ORDER — EPHEDRINE 5 MG/ML INJ
INTRAVENOUS | Status: AC
  Filled 2013-06-01: qty 10

## 2013-06-01 MED ORDER — OXYTOCIN 10 UNIT/ML IJ SOLN
INTRAMUSCULAR | Status: DC | PRN
  Administered 2013-06-01: 40 [IU] via INTRAMUSCULAR

## 2013-06-01 MED ORDER — FLEET ENEMA 7-19 GM/118ML RE ENEM: 1.0000 | ENEMA | Freq: Every day | RECTAL | Status: DC | PRN

## 2013-06-01 MED ORDER — FENTANYL CITRATE 0.05 MG/ML IJ SOLN
INTRAMUSCULAR | Status: DC | PRN
  Administered 2013-06-01: 10 ug via INTRATHECAL

## 2013-06-01 MED ORDER — CIPROFLOXACIN IN D5W 400 MG/200ML IV SOLN
400.0000 mg | INTRAVENOUS | Status: AC
  Administered 2013-06-01: 400 mg via INTRAVENOUS
  Filled 2013-06-01: qty 200

## 2013-06-01 MED ORDER — SIMETHICONE 80 MG PO CHEW
80.0000 mg | CHEWABLE_TABLET | ORAL | Status: DC
  Administered 2013-06-02 – 2013-06-03 (×2): 80 mg via ORAL
  Filled 2013-06-01 (×2): qty 1

## 2013-06-01 MED ORDER — SENNOSIDES-DOCUSATE SODIUM 8.6-50 MG PO TABS
2.0000 | ORAL_TABLET | ORAL | Status: DC
  Administered 2013-06-02 – 2013-06-03 (×2): 2 via ORAL
  Filled 2013-06-01 (×2): qty 2

## 2013-06-01 MED ORDER — ONDANSETRON HCL 4 MG/2ML IJ SOLN
INTRAMUSCULAR | Status: AC
  Filled 2013-06-01: qty 2

## 2013-06-01 MED ORDER — MEPERIDINE HCL 25 MG/ML IJ SOLN
INTRAMUSCULAR | Status: DC | PRN
  Administered 2013-06-01: 12.5 mg via INTRAVENOUS

## 2013-06-01 MED ORDER — DEXTROSE 5 % IV SOLN
1.0000 ug/kg/h | INTRAVENOUS | Status: DC | PRN
  Filled 2013-06-01: qty 2

## 2013-06-01 MED ORDER — IBUPROFEN 800 MG PO TABS
800.0000 mg | ORAL_TABLET | Freq: Three times a day (TID) | ORAL | Status: DC | PRN
  Administered 2013-06-01 – 2013-06-03 (×6): 800 mg via ORAL
  Filled 2013-06-01 (×6): qty 1

## 2013-06-01 MED ORDER — LANOLIN HYDROUS EX OINT
1.0000 | TOPICAL_OINTMENT | CUTANEOUS | Status: DC | PRN
Start: 2013-06-01 — End: 2013-06-03

## 2013-06-01 MED ORDER — SODIUM CHLORIDE 0.9 % IJ SOLN: 3.0000 mL | INTRAMUSCULAR | Status: DC | PRN

## 2013-06-01 MED ORDER — SODIUM CHLORIDE 0.9 % IJ SOLN
3.0000 mL | INTRAMUSCULAR | Status: DC | PRN
Start: 2013-06-01 — End: 2013-06-01

## 2013-06-01 MED ORDER — LACTATED RINGERS IV SOLN
INTRAVENOUS | Status: DC
  Administered 2013-06-01: 09:00:00 via INTRAVENOUS

## 2013-06-01 MED ORDER — NALOXONE HCL 0.4 MG/ML IJ SOLN: 0.4000 mg | INTRAMUSCULAR | Status: DC | PRN

## 2013-06-01 MED ORDER — TETANUS-DIPHTH-ACELL PERTUSSIS 5-2.5-18.5 LF-MCG/0.5 IM SUSP: 0.5000 mL | Freq: Once | INTRAMUSCULAR | Status: DC

## 2013-06-01 MED ORDER — PHENYLEPHRINE 8 MG IN D5W 100 ML (0.08MG/ML) PREMIX OPTIME
INJECTION | INTRAVENOUS | Status: DC | PRN
  Administered 2013-06-01: 60 ug/min via INTRAVENOUS

## 2013-06-01 MED ORDER — SIMETHICONE 80 MG PO CHEW
80.0000 mg | CHEWABLE_TABLET | ORAL | Status: DC | PRN
  Administered 2013-06-01 – 2013-06-02 (×2): 80 mg via ORAL
  Filled 2013-06-01 (×2): qty 1

## 2013-06-01 MED ORDER — DIBUCAINE 1 % RE OINT: 1.0000 "application " | TOPICAL_OINTMENT | RECTAL | Status: DC | PRN

## 2013-06-01 MED ORDER — MEASLES, MUMPS & RUBELLA VAC ~~LOC~~ INJ
0.5000 mL | INJECTION | Freq: Once | SUBCUTANEOUS | Status: DC
  Filled 2013-06-01: qty 0.5

## 2013-06-01 MED ORDER — DIPHENHYDRAMINE HCL 50 MG/ML IJ SOLN
12.5000 mg | INTRAMUSCULAR | Status: DC | PRN
Start: 2013-06-01 — End: 2013-06-03

## 2013-06-01 MED ORDER — WITCH HAZEL-GLYCERIN EX PADS: 1.0000 "application " | MEDICATED_PAD | CUTANEOUS | Status: DC | PRN

## 2013-06-01 MED ORDER — MENTHOL 3 MG MT LOZG: 1.0000 | LOZENGE | OROMUCOSAL | Status: DC | PRN

## 2013-06-01 MED ORDER — NALBUPHINE HCL 10 MG/ML IJ SOLN
5.0000 mg | INTRAMUSCULAR | Status: DC | PRN
Start: 2013-06-01 — End: 2013-06-03

## 2013-06-01 MED ORDER — CITRIC ACID-SODIUM CITRATE 334-500 MG/5ML PO SOLN
30.0000 mL | Freq: Once | ORAL | Status: AC
  Administered 2013-06-01: 30 mL via ORAL
  Filled 2013-06-01: qty 15

## 2013-06-01 MED ORDER — OXYTOCIN 10 UNIT/ML IJ SOLN
INTRAMUSCULAR | Status: AC
  Filled 2013-06-01: qty 4

## 2013-06-01 MED ORDER — ONDANSETRON HCL 4 MG/2ML IJ SOLN
INTRAMUSCULAR | Status: DC | PRN
  Administered 2013-06-01: 4 mg via INTRAVENOUS

## 2013-06-01 MED ORDER — BISACODYL 10 MG RE SUPP: 10.0000 mg | Freq: Every day | RECTAL | Status: DC | PRN

## 2013-06-01 MED ORDER — DIPHENHYDRAMINE HCL 25 MG PO CAPS: 25.0000 mg | ORAL_CAPSULE | Freq: Four times a day (QID) | ORAL | Status: DC | PRN

## 2013-06-01 MED ORDER — SCOPOLAMINE 1 MG/3DAYS TD PT72: 1.0000 | MEDICATED_PATCH | Freq: Once | TRANSDERMAL | Status: DC

## 2013-06-01 MED ORDER — LACTATED RINGERS IV SOLN
INTRAVENOUS | Status: DC | PRN
Start: 2013-06-01 — End: 2013-06-01
  Administered 2013-06-01 (×2): via INTRAVENOUS

## 2013-06-01 MED ORDER — NALBUPHINE HCL 10 MG/ML IJ SOLN: 5.0000 mg | INTRAMUSCULAR | Status: DC | PRN

## 2013-06-01 MED ORDER — PRENATAL MULTIVITAMIN CH
1.0000 | ORAL_TABLET | Freq: Every day | ORAL | Status: DC
  Administered 2013-06-01 – 2013-06-03 (×3): 1 via ORAL
  Filled 2013-06-01 (×4): qty 1

## 2013-06-01 MED ORDER — KETOROLAC TROMETHAMINE 30 MG/ML IJ SOLN: 15.0000 mg | Freq: Once | INTRAMUSCULAR | Status: DC | PRN

## 2013-06-01 MED ORDER — MEPERIDINE HCL 25 MG/ML IJ SOLN
INTRAMUSCULAR | Status: AC
  Filled 2013-06-01: qty 1

## 2013-06-01 MED ORDER — PHENYLEPHRINE HCL 10 MG/ML IJ SOLN: INTRAMUSCULAR | Status: DC | PRN

## 2013-06-01 MED ORDER — IBUPROFEN 600 MG PO TABS
600.0000 mg | ORAL_TABLET | Freq: Four times a day (QID) | ORAL | Status: DC | PRN
Start: 2013-06-01 — End: 2013-06-03

## 2013-06-01 MED ORDER — ONDANSETRON HCL 4 MG/2ML IJ SOLN: 4.0000 mg | Freq: Three times a day (TID) | INTRAMUSCULAR | Status: DC | PRN

## 2013-06-01 MED ORDER — ONDANSETRON HCL 4 MG/2ML IJ SOLN: 4.0000 mg | INTRAMUSCULAR | Status: DC | PRN

## 2013-06-01 MED ORDER — LAMOTRIGINE 150 MG PO TABS
150.0000 mg | ORAL_TABLET | Freq: Every day | ORAL | Status: DC
  Administered 2013-06-01 – 2013-06-02 (×2): 150 mg via ORAL
  Filled 2013-06-01 (×3): qty 1

## 2013-06-01 MED ORDER — METRONIDAZOLE IN NACL 5-0.79 MG/ML-% IV SOLN
500.0000 mg | INTRAVENOUS | Status: AC
  Administered 2013-06-01: 1 g via INTRAVENOUS
  Filled 2013-06-01: qty 100

## 2013-06-01 MED ORDER — METOCLOPRAMIDE HCL 5 MG/ML IJ SOLN: 10.0000 mg | Freq: Three times a day (TID) | INTRAMUSCULAR | Status: DC | PRN

## 2013-06-01 MED ORDER — FAMOTIDINE IN NACL 20-0.9 MG/50ML-% IV SOLN
20.0000 mg | Freq: Once | INTRAVENOUS | Status: AC
  Administered 2013-06-01: 20 mg via INTRAVENOUS
  Filled 2013-06-01: qty 50

## 2013-06-01 SURGICAL SUPPLY — 27 items
BENZOIN TINCTURE PRP APPL 2/3 (GAUZE/BANDAGES/DRESSINGS) ×2 IMPLANT
CLAMP CORD UMBIL (MISCELLANEOUS) IMPLANT
CLIP FILSHIE TUBAL LIGA STRL (Clip) ×2 IMPLANT
CLOTH BEACON ORANGE TIMEOUT ST (SAFETY) ×2 IMPLANT
DRAPE LG THREE QUARTER DISP (DRAPES) IMPLANT
DRSG OPSITE POSTOP 4X10 (GAUZE/BANDAGES/DRESSINGS) ×2 IMPLANT
DURAPREP 26ML APPLICATOR (WOUND CARE) ×2 IMPLANT
ELECT REM PT RETURN 9FT ADLT (ELECTROSURGICAL) ×2
ELECTRODE REM PT RTRN 9FT ADLT (ELECTROSURGICAL) ×1 IMPLANT
EXTRACTOR VACUUM M CUP 4 TUBE (SUCTIONS) IMPLANT
GLOVE BIO SURGEON STRL SZ7 (GLOVE) ×2 IMPLANT
GOWN STRL REUS W/TWL LRG LVL3 (GOWN DISPOSABLE) ×4 IMPLANT
KIT ABG SYR 3ML LUER SLIP (SYRINGE) ×2 IMPLANT
NEEDLE HYPO 25X5/8 SAFETYGLIDE (NEEDLE) ×2 IMPLANT
NS IRRIG 1000ML POUR BTL (IV SOLUTION) ×2 IMPLANT
PACK C SECTION WH (CUSTOM PROCEDURE TRAY) ×2 IMPLANT
PAD ABD 7.5X8 STRL (GAUZE/BANDAGES/DRESSINGS) ×2 IMPLANT
PAD OB MATERNITY 4.3X12.25 (PERSONAL CARE ITEMS) ×2 IMPLANT
STRIP CLOSURE SKIN 1/2X4 (GAUZE/BANDAGES/DRESSINGS) ×2 IMPLANT
SUT CHROMIC 0 CTX 36 (SUTURE) ×6 IMPLANT
SUT MON AB 4-0 PS1 27 (SUTURE) ×2 IMPLANT
SUT PDS AB 0 CT1 27 (SUTURE) ×4 IMPLANT
SUT VIC AB 3-0 CT1 27 (SUTURE) ×2
SUT VIC AB 3-0 CT1 TAPERPNT 27 (SUTURE) ×2 IMPLANT
TOWEL OR 17X24 6PK STRL BLUE (TOWEL DISPOSABLE) ×2 IMPLANT
TRAY FOLEY CATH 14FR (SET/KITS/TRAYS/PACK) ×2 IMPLANT
WATER STERILE IRR 1000ML POUR (IV SOLUTION) IMPLANT

## 2013-06-01 NOTE — Op Note (Signed)
Preoperative diagnosis: 38 week IUP, labor, previous cesarean section, and desires repeat, request permanent sterilization  Postoperative diagnosis: Same  Procedure: Repeat low transverse cesarean section, Filshie clip tubal ligation.  Surgeon: Marcelle OverlieHolland  Anesthesia: Spinal  EBL: 700 cc  Drains: Foley  Procedure and findings:  Patient was taken the operating room after an adequate level of spinal anesthetic was obtained with the patient in left tilt position the abdomen prepped and draped in the usual fashion, Foley catheter positioned draining clear urine. Appropriate timeout taken at that point. After prepping and draping, transverse incision was made excising the old scar this is carried down to the fascia which was incised and extended transversely. Rectus muscles divided in the midline, peritoneum entered superiorly without incident and extended in a vertical fashion. The vesicouterine serosa was incised and bluntly and sharply dissected below, bladder blade repositioned. Transverse incision made in the lower segment sent extended with blunt dissection clear fluid noted the patient then delivered of a healthy infant Apgars 9 and 9 the infant was suctioned cord clamped and passed the pediatric team for further care. Placenta was then delivered manually intact, uterus exteriorized cavity wiped clean with laparotomy pack closure transversely of 0 chromic in a locked fashion followed by an imbricating layer of 0 chromic. This is hemostatic the bladder flap area was intact and hemostatic.  Bilateral tubes and ovaries were normal, Babcock clamp was then used to grasp the tube, Filshie clip was applied a right angle 2 cm from the cornu on each side with excellent application. Prior to closure sponge denies precast reported as correct x2 peritoneum closed the running 2-0 Vicryl suture. 2-0 Vicryl interrupted sutures used to reapproximate the rectus muscles in the midline. Fascia closure laterally to  midline on either side with 0 PDS suture. Subcutaneous tissue was hemostatic 4-0 Monocryl subcuticular closure with Steri-Strips and pressure dressing she tolerated this well went to recovery room in good condition.  Dictated with [facility]  Tennile Styles M. Milana ObeyHolland M.D.

## 2013-06-01 NOTE — Anesthesia Procedure Notes (Signed)
Spinal  Patient location during procedure: OR Start time: 06/01/2013 1:19 AM End time: 06/01/2013 1:22 AM Staffing Anesthesiologist: Leilani AbleHATCHETT, Alfonzia Woolum Performed by: anesthesiologist  Preanesthetic Checklist Completed: patient identified, surgical consent, pre-op evaluation, timeout performed, IV checked, risks and benefits discussed and monitors and equipment checked Spinal Block Patient position: sitting Prep: DuraPrep Patient monitoring: heart rate, cardiac monitor, continuous pulse ox and blood pressure Approach: midline Location: L3-4 Injection technique: single-shot Needle Needle type: Pencan  Needle gauge: 24 G Needle length: 9 cm Needle insertion depth: 4 cm Assessment Sensory level: T4

## 2013-06-01 NOTE — Anesthesia Preprocedure Evaluation (Addendum)
Anesthesia Evaluation  Patient identified by MRN, date of birth, ID band Patient awake    Reviewed: Allergy & Precautions, H&P , NPO status , Patient's Chart, lab work & pertinent test results  Airway Mallampati: I TM Distance: >3 FB Neck ROM: full    Dental no notable dental hx.    Pulmonary neg pulmonary ROS,    Pulmonary exam normal       Cardiovascular negative cardio ROS      Neuro/Psych PSYCHIATRIC DISORDERS Bipolar Disorder negative neurological ROS     GI/Hepatic Neg liver ROS,   Endo/Other  negative endocrine ROS  Renal/GU negative Renal ROS     Musculoskeletal   Abdominal Normal abdominal exam  (+)   Peds  Hematology negative hematology ROS (+)   Anesthesia Other Findings   Reproductive/Obstetrics (+) Pregnancy                           Anesthesia Physical Anesthesia Plan  ASA: II  Anesthesia Plan: Spinal   Post-op Pain Management:    Induction:   Airway Management Planned:   Additional Equipment:   Intra-op Plan:   Post-operative Plan:   Informed Consent: I have reviewed the patients History and Physical, chart, labs and discussed the procedure including the risks, benefits and alternatives for the proposed anesthesia with the patient or authorized representative who has indicated his/her understanding and acceptance.     Plan Discussed with: CRNA and Surgeon  Anesthesia Plan Comments:        Anesthesia Quick Evaluation

## 2013-06-01 NOTE — Anesthesia Postprocedure Evaluation (Signed)
Anesthesia Post Note  Patient: Kayla Chavez  Procedure(s) Performed: Procedure(s) (LRB): CESAREAN SECTION (N/A)  Anesthesia type: Spinal  Patient location: Mother/Baby  Post pain: Pain level controlled  Post assessment: Post-op Vital signs reviewed  Last Vitals:  Filed Vitals:   06/01/13 0815  BP: 105/55  Pulse: 64  Temp: 36.6 C  Resp: 18    Post vital signs: Reviewed  Level of consciousness: awake  Complications: No apparent anesthesia complications

## 2013-06-01 NOTE — Progress Notes (Signed)
Subjective: Postpartum Day 0: Cesarean Delivery Patient reports tolerating PO.    Objective: Vital signs in last 24 hours: Temp:  [97.8 F (36.6 C)-98.4 F (36.9 C)] 97.8 F (36.6 C) (01/29 0700) Pulse Rate:  [65-95] 71 (01/29 0700) Resp:  [13-22] 18 (01/29 0611) BP: (105-118)/(54-73) 107/57 mmHg (01/29 0700) SpO2:  [97 %-100 %] 97 % (01/29 0700) Weight:  [157 lb 6.4 oz (71.396 kg)] 157 lb 6.4 oz (71.396 kg) (01/28 1958)  Physical Exam:  General: alert and cooperative Lochia: appropriate Uterine Fundus: firm Incision: abd dressing CDI DVT Evaluation: No evidence of DVT seen on physical exam. Negative Homan's sign. No cords or calf tenderness. No significant calf/ankle edema.   Recent Labs  06/01/13 0015 06/01/13 0620  HGB 12.6 11.2*  HCT 35.1* 32.1*    Assessment/Plan: Status post Cesarean section. Doing well postoperatively.  Continue current care.  Kayla Chavez 06/01/2013, 8:33 AM

## 2013-06-01 NOTE — Anesthesia Postprocedure Evaluation (Signed)
Anesthesia Post Note  Patient: Kayla Chavez  Procedure(s) Performed: Procedure(s) (LRB): CESAREAN SECTION (N/A)  Anesthesia type: Spinal  Patient location: PACU  Post pain: Pain level controlled  Post assessment: Post-op Vital signs reviewed  Last Vitals:  Filed Vitals:   06/01/13 0315  BP: 115/62  Pulse: 93  Temp:   Resp: 22    Post vital signs: Reviewed  Level of consciousness: awake  Complications: No apparent anesthesia complications

## 2013-06-01 NOTE — Lactation Note (Signed)
This note was copied from the chart of Kayla Chavez. Lactation Consultation Note  Patient Name: Kayla Neldon LabellaJillian Utecht JXBJY'NToday's Date: 06/01/2013 Reason for consult: Other (Comment) (update for exclusion)   Maternal Data Formula Feeding for Exclusion: Yes Reason for exclusion: Mother's choice to formula feed on admision;Taking certain medications (RN, Gaylyn RongKris informs LC that mom on contraindicated meds)  Feeding Feeding Type: Bottle Fed - Formula Nipple Type: Regular  LATCH Score/Interventions                      Lactation Tools Discussed/Used     Consult Status Consult Status: Complete    Lynda RainwaterBryant, Alexah Kivett [place] 06/01/2013, 4:55 PM

## 2013-06-01 NOTE — H&P (Signed)
Kayla Chavez is a 28 y.o. female presenting for SOL, for R CS + BTL. Maternal Medical History:  Reason for admission: Contractions.   Contractions: Onset was 3-5 hours ago.   Frequency: regular.   Perceived severity is moderate.    Fetal activity: Perceived fetal activity is normal.   Last perceived fetal movement was within the past hour.      OB History   Grav Para Term Preterm Abortions TAB SAB Ect Mult Living   2 1 1  0 0 0 0 0 0 1     Past Medical History  Diagnosis Date  . Abnormal Pap smear of cervix   . GERD (gastroesophageal reflux disease)   . Palpitations   . Stress reaction   . HSV-2 infection   . Complication of anesthesia     during wisdom teeth extraction, experience tachycardia with anesthesia   . Bipolar 1 disorder   . Bronchitis    Past Surgical History  Procedure Laterality Date  . Tonsillectomy    . Knee surgery    . Wisdom tooth extraction    . Cesarean section  06/09/2012    Procedure: CESAREAN SECTION;  Surgeon: Meriel Picaichard M Zenaida Tesar, MD;  Location: [facility];  Service: Obstetrics;  Laterality: N/A;  Primary Cesarean Section Delivery Baby Boy @ [phone], Apgars  9/9   Family History: family history includes Alcohol abuse in her paternal grandfather; Cancer in her paternal grandfather; Hyperlipidemia in her father and paternal grandfather; Hypertension in her father; Leukemia in her paternal uncle. Social History:  reports that she has never smoked. She does not have any smokeless tobacco history on file. She reports that she drinks alcohol. She reports that she does not use illicit drugs.   Prenatal Transfer Tool  Maternal Diabetes: No Genetic Screening: Normal Maternal Ultrasounds/Referrals: Normal Fetal Ultrasounds or other Referrals:  None Maternal Substance Abuse:  No Significant Maternal Medications:  None Significant Maternal Lab Results:  None Other Comments:  None  ROS  Dilation: 4 Effacement (%): 100 Station: -2 Exam by:: Judeth HornErin Lawrence  RNC Blood pressure 107/73, pulse 95, temperature 98 F (36.7 C), resp. rate 18, height 5\' 5"  (1.651 m), weight 71.396 kg (157 lb 6.4 oz). Maternal Exam:  Uterine Assessment: Contraction strength is moderate.  Abdomen: Patient reports no abdominal tenderness. Surgical scars: low transverse.   Fundal height is term FH.   Estimated fetal weight is AGA.   Fetal presentation: vertex  Introitus: Normal vulva. Normal vagina.  Pelvis: adequate for delivery.   Cervix: Cervix evaluated by digital exam.     Physical Exam  Constitutional: She is oriented to person, place, and time. She appears well-developed and well-nourished.  HENT:  Head: Normocephalic and atraumatic.  Neck: Normal range of motion. Neck supple.  Cardiovascular: Normal rate and regular rhythm.   Respiratory: Effort normal and breath sounds normal.  GI:  Term FH/FHR 148  Genitourinary:  4/C/vtx  BOW I  Musculoskeletal: Normal range of motion.  Neurological: She is alert and oriented to person, place, and time.    Prenatal labs: ABO, Rh: --/--/O POS (07/30 0300) Antibody:   Rubella:   RPR: NON REACTIVE (02/07 0520)  HBsAg:    HIV:    GBS: Positive (01/17 0000)   Assessment/Plan: Term IUP/labor, prior CS for R CS + BTL, procedure + risks reviewed. Permanence of procedure, failure rate of 2-07/998 explained   Luiscarlos Kaczmarczyk M 06/01/2013, 12:23 AM

## 2013-06-01 NOTE — Transfer of Care (Signed)
Immediate Anesthesia Transfer of Care Note  Patient: Kayla OxfordJillian L Chamber  Procedure(s) Performed: Procedure(s): CESAREAN SECTION (N/A)  Patient Location: PACU  Anesthesia Type:Spinal  Level of Consciousness: awake, alert , oriented and patient cooperative  Airway & Oxygen Therapy: Patient Spontanous Breathing  Post-op Assessment: Report given to PACU RN and Post -op Vital signs reviewed and stable  Post vital signs: Reviewed and stable  Complications: No apparent anesthesia complications

## 2013-06-02 ENCOUNTER — Encounter (HOSPITAL_COMMUNITY): Payer: Self-pay | Admitting: Obstetrics and Gynecology

## 2013-06-02 NOTE — Progress Notes (Signed)
Subjective: Postpartum Day 1: Cesarean Delivery Patient reports tolerating PO and no problems voiding.  Patient does complain of abd bloating and gas  Objective: Vital signs in last 24 hours: Temp:  [97.6 F (36.4 C)-98.3 F (36.8 C)] 97.6 F (36.4 C) (01/30 0640) Pulse Rate:  [64-109] 71 (01/30 0640) Resp:  [18] 18 (01/30 0640) BP: (91-109)/(51-67) 109/67 mmHg (01/30 0640) SpO2:  [96 %-99 %] 98 % (01/30 0100)  Physical Exam:  General: alert and cooperative Lochia: appropriate Uterine Fundus: firm Incision: abd dressing noted with old drainage noted on R margin of bandage. Dressing and honeycomb dressing removed without active bleeding noted. Honeycomb dressing replaced. DVT Evaluation: No evidence of DVT seen on physical exam. Negative Homan's sign. No cords or calf tenderness. No significant calf/ankle edema.   Recent Labs  06/01/13 0015 06/01/13 0620  HGB 12.6 11.2*  HCT 35.1* 32.1*    Assessment/Plan: Status post Cesarean section. Doing well postoperatively.  Continue current care. Encouraged warm beverages and increased ambulation Sumayyah Custodio G 06/02/2013, 8:14 AM

## 2013-06-02 NOTE — Progress Notes (Signed)
CSW met with pt briefly to assess history of mental illnesses (bipolar, anxiety, OCD & ADHD) & offer resources as needed.  Pts symptoms are managed by Meredith Baker, NP.  She is currently taking Lamictal & seeing Meredith once every 6 months or as needed.  She has a 2 year relationship established with her mental health provider & feels very comfortable reaching out to her as needed.  Pt has discussed the increased risk with Meredith & seems to be taking a proactive tx approach.  Pt was very attenative to the infant during conversation & appears appropriate at this time.  Pt was a good support system & all the necessary supplies for the infant.  CSW does not identify any barriers to discharge at this time.  

## 2013-06-03 MED ORDER — IBUPROFEN 600 MG PO TABS: 600.0000 mg | ORAL_TABLET | Freq: Four times a day (QID) | ORAL | Status: DC | PRN

## 2013-06-03 MED ORDER — OXYCODONE-ACETAMINOPHEN 5-325 MG PO TABS: 1.0000 | ORAL_TABLET | Freq: Four times a day (QID) | ORAL | Status: DC | PRN

## 2013-06-03 NOTE — Progress Notes (Signed)
Subjective: Postpartum Day 2: Cesarean Delivery Patient reports incisional pain and tolerating PO.    Objective: Vital signs in last 24 hours: Temp:  [97.8 F (36.6 C)-98 F (36.7 C)] 97.8 F (36.6 C) (01/31 0615) Pulse Rate:  [80] 80 (01/31 0615) Resp:  [18] 18 (01/31 0615) BP: (107-110)/(63-67) 107/67 mmHg (01/31 0615)  Physical Exam:  General: alert, cooperative and appears stated age Lochia: appropriate Uterine Fundus: firm Incision: healing well, no significant drainage, no dehiscence, no significant erythema DVT Evaluation: No evidence of DVT seen on physical exam.   Recent Labs  06/01/13 0015 06/01/13 0620  HGB 12.6 11.2*  HCT 35.1* 32.1*    Assessment/Plan: Status post Cesarean section. Doing well postoperatively.  Discharge home with standard precautions and return to clinic on Tuesday.Marland Kitchen.  Kayla Chavez 06/03/2013, 8:17 AM

## 2013-06-03 NOTE — Discharge Summary (Signed)
Obstetric Discharge Summary Reason for Admission: cesarean section Prenatal Procedures: none Intrapartum Procedures: cesarean: low cervical, transverse Postpartum Procedures: none Complications-Operative and Postpartum: none Hemoglobin  Date Value Range Status  06/01/2013 11.2* 12.0 - 15.0 g/dL Final     HCT  Date Value Range Status  06/01/2013 32.1* 36.0 - 46.0 % Final    Physical Exam:  General: alert Lochia: appropriate Uterine Fundus: firm Incision: healing well, no significant drainage, no dehiscence, no significant erythema DVT Evaluation: No evidence of DVT seen on physical exam.  Discharge Diagnoses: Term Pregnancy-delivered  Discharge Information: Date: 06/03/2013 Activity: pelvic rest Diet: routine Medications: Ibuprofen and Percocet Condition: stable Instructions: refer to practice specific booklet Discharge to: home   Newborn Data: Live born female  Birth Weight: 6 lb 5.9 oz (2889 g) APGAR: 9, 9  Home with mother.  Persephone Schriever L 06/03/2013, 8:18 AM

## 2013-06-12 ENCOUNTER — Other Ambulatory Visit: Payer: Self-pay | Admitting: Family Medicine

## 2013-06-12 ENCOUNTER — Other Ambulatory Visit (HOSPITAL_COMMUNITY): Payer: BC Managed Care – PPO

## 2013-06-12 NOTE — Telephone Encounter (Signed)
She had her baby I believe - please ask if she is still taking this right now and if she is breastfeeding ?

## 2013-06-12 NOTE — Telephone Encounter (Signed)
Electronic refill request, please advise (chart says pt [redacted] weeks pregnant)

## 2013-06-13 NOTE — Telephone Encounter (Signed)
I think she had her baby-please ask if she is still taking this and if so is she breastfeeding? thanks

## 2013-06-14 NOTE — Telephone Encounter (Signed)
Pt did have baby and baby is formula fed, Rx refilled

## 2013-06-15 ENCOUNTER — Inpatient Hospital Stay (HOSPITAL_COMMUNITY)
Admission: RE | Admit: 2013-06-15 | Payer: BC Managed Care – PPO | Source: Ambulatory Visit | Admitting: Obstetrics and Gynecology

## 2013-06-15 ENCOUNTER — Encounter (HOSPITAL_COMMUNITY): Admission: RE | Payer: Self-pay | Source: Ambulatory Visit

## 2013-06-15 SURGERY — Surgical Case
Anesthesia: Regional | Laterality: Bilateral

## 2013-12-04 ENCOUNTER — Telehealth: Payer: Self-pay | Admitting: Family Medicine

## 2013-12-04 NOTE — Telephone Encounter (Signed)
Patient said you wanted her to call you back.  She's going back on birth control.

## 2013-12-04 NOTE — Telephone Encounter (Signed)
Left message asking patient to call in with pill preference and pharmacy preference.

## 2013-12-04 NOTE — Telephone Encounter (Signed)
Does she have a preference for which pill ? - what pharmacy to send it to also  thanks

## 2013-12-05 MED ORDER — DROSPIRENONE-ETHINYL ESTRADIOL 3-0.02 MG PO TABS: 1.0000 | ORAL_TABLET | Freq: Every day | ORAL | Status: DC

## 2013-12-05 NOTE — Addendum Note (Signed)
Addended by: Roxy MannsWER, MARNE A on: 12/05/2013 11:52 AM   Modules accepted: Orders

## 2013-12-05 NOTE — Telephone Encounter (Signed)
Patient notified

## 2013-12-05 NOTE — Telephone Encounter (Signed)
I will send this in -let her know

## 2013-12-05 NOTE — Telephone Encounter (Signed)
[facility] to [facility].

## 2013-12-25 ENCOUNTER — Telehealth: Payer: Self-pay | Admitting: Family Medicine

## 2013-12-25 MED ORDER — VALACYCLOVIR HCL 500 MG PO TABS: ORAL_TABLET | ORAL | Status: DC

## 2013-12-25 MED ORDER — DROSPIRENONE-ETHINYL ESTRADIOL 3-0.02 MG PO TABS: 1.0000 | ORAL_TABLET | Freq: Every day | ORAL | Status: DC

## 2013-12-25 NOTE — Telephone Encounter (Signed)
Pt wanted [place] to send in Rx to [place], done and pt notified

## 2013-12-25 NOTE — Telephone Encounter (Signed)
Pt calling because she will be without insurance starting 01/02/2014 and wanted to know if you would go ahead and write a 90 day supply RX of her birth control and valtrex, so that she may have it filled. Thank you

## 2013-12-25 NOTE — Telephone Encounter (Signed)
No problem Px printed for pick up in IN box  

## 2014-03-05 ENCOUNTER — Encounter (HOSPITAL_COMMUNITY): Payer: Self-pay | Admitting: Obstetrics and Gynecology

## 2014-03-09 ENCOUNTER — Telehealth: Payer: Self-pay | Admitting: Family Medicine

## 2014-03-09 NOTE — Telephone Encounter (Signed)
[facility], will call back with appt. once they open the scheduled   (Dr. Patsy Lageropland working sat)

## 2014-03-09 NOTE — Telephone Encounter (Signed)
appt scheduled with Dr. Patsy Lageropland at [facility]

## 2014-03-09 NOTE — Telephone Encounter (Signed)
Patient Information:  Caller Name: Kayla Chavez  Phone: [phone]  Patient: Kayla Chavez, Kayla Chavez  Gender: Female  DOB: 03/06/1986  Age: 28 Years  PCP: Roxy Mannsower, Marne [facility](Family Practice)  Pregnant: No  Office Follow Up:  Does the office need to follow up with this patient?: Yes  Instructions For The Office: Patient requesting to be seen today  RN Note:  Patient calling with c/o cough and cold.  No relief with at home measures.  Requesting to be seen today.  Symptoms  Reason For Call & Symptoms: cough and cold symptoms  Reviewed Health History In EMR: Yes  Reviewed Medications In EMR: Yes  Reviewed Allergies In EMR: Yes  Reviewed Surgeries / Procedures: Yes  Date of Onset of Symptoms: 02/16/2014  Any Fever: Yes  Fever Taken: Tactile  Fever Time Of Reading: 06:27:32  Fever Last Reading: N/A OB / GYN:  LMP: Unknown  Guideline(s) Used:  Cough  Disposition Per Guideline:   See Today or Tomorrow in Office  Reason For Disposition Reached:   Patient wants to be seen  Advice Given:  N/A  Patient Will Follow Care Advice:  YES

## 2014-03-09 NOTE — Telephone Encounter (Signed)
She wanted 3 fam members to be seen - will have to go to [facility] or [facility]

## 2014-03-10 ENCOUNTER — Encounter: Payer: Self-pay | Admitting: Family Medicine

## 2014-03-10 ENCOUNTER — Ambulatory Visit (INDEPENDENT_AMBULATORY_CARE_PROVIDER_SITE_OTHER): Payer: 59 | Admitting: Family Medicine

## 2014-03-10 VITALS — BP 102/60 | Temp 98.3°F | Wt 134.0 lb

## 2014-03-10 DIAGNOSIS — J208 Acute bronchitis due to other specified organisms: Secondary | ICD-10-CM

## 2014-03-10 MED ORDER — DOXYCYCLINE HYCLATE 100 MG PO TABS: 100.0000 mg | ORAL_TABLET | Freq: Two times a day (BID) | ORAL | Status: DC

## 2014-03-10 NOTE — Progress Notes (Signed)
Dr. Karleen HampshireSpencer T. Kayla Kief, Chavez, CAQ Sports Medicine [facility] [address] [place], [address] Phone: [phone] Fax: [phone]  03/10/2014  Patient: Kayla Chavez, MRN: [id number], DOB: 09/29/1985, 28 y.o.  Primary Physician:  Roxy MannsMarne Tower, Chavez  Chief Complaint: possible URI  Subjective:   Kayla Chavez is a 28 y.o. very pleasant female patient who presents with the following:  3 weeks, productive cough.   Very pleasant young woman with multiple family members at home who presents with worsening symptoms where she has been sick for 3 weeks continuously.  Particularly over the last 2 or 3 days the patient has worsened and she has developed some significant congestion in her chest and has been coughing up copious amounts of sputum.  She also has some nasal congestion and sinus congestion.  She is currently afebrile.  She does not having any significant nausea, vomiting, or diarrhea.  No sore throat and no earache.  Past Medical History, Surgical History, Social History, Family History, Problem List, Medications, and Allergies have been reviewed and updated if relevant.  ROS: GEN: Acute illness details above GI: Tolerating PO intake GU: maintaining adequate hydration and urination Pulm: No SOB Interactive and getting along well at home.  Otherwise, ROS is as per the HPI.   Objective:   BP 102/60 mmHg  Temp(Src) 98.3 F (36.8 C)  Wt 134 lb (60.782 kg)   GEN: A and O x 3. WDWN. NAD.    ENT: Nose clear, ext NML.  No LAD.  No JVD.  TM's clear. Oropharynx clear.  PULM: Normal WOB, no distress. No crackles, wheezes, rhonchi. CV: RRR, no M/G/R, No rubs, No JVD.   EXT: warm and well-perfused, No c/c/e. PSYCH: Pleasant and conversant.    Laboratory and Imaging Data:  Assessment and Plan:   Acute bronchitis due to other specified organisms  With 3 weeks of symptoms, recommended that the patient go on some antibiotics for coverage for atypical  organisms.  I think in this case with this patient you would want to cover Mycoplasma, and we will treat her with doxycycline.  Otherwise continue with conservative management.  Follow-up: No Follow-up on file.  New Prescriptions   DOXYCYCLINE (VIBRA-TABS) 100 MG TABLET    Take 1 tablet (100 mg total) by mouth 2 (two) times daily.   No orders of the defined types were placed in this encounter.    Signed,  Kayla Chavez   Patient's Medications  New Prescriptions   DOXYCYCLINE (VIBRA-TABS) 100 MG TABLET    Take 1 tablet (100 mg total) by mouth 2 (two) times daily.  Previous Medications   ASCORBIC ACID (VITAMIN C DROPS MT)    Use as directed 2 lozenges in the mouth or throat daily.   CALCIUM CARBONATE (TUMS - DOSED IN MG ELEMENTAL CALCIUM) 500 MG CHEWABLE TABLET    Chew 1 tablet by mouth daily.   DROSPIRENONE-ETHINYL ESTRADIOL (YAZ,GIANVI,LORYNA) 3-0.02 MG TABLET    Take 1 tablet by mouth daily.   FOLIC ACID (FOLVITE) 1 MG TABLET    Take 1 mg by mouth 2 (two) times daily.    IBUPROFEN (ADVIL,MOTRIN) 600 MG TABLET    Take 1 tablet (600 mg total) by mouth every 6 (six) hours as needed for mild pain.   LAMOTRIGINE (LAMICTAL) 150 MG TABLET    Take 150 mg by mouth at bedtime.    OXYCODONE-ACETAMINOPHEN (PERCOCET/ROXICET) 5-325 MG PER TABLET    Take 1-2 tablets by  mouth every 6 (six) hours as needed for moderate pain.   PRENATAL VIT-FE FUMARATE-FA (PRENATAL MULTIVITAMIN) TABS    Take 1 tablet by mouth at bedtime.   VALACYCLOVIR (VALTREX) 500 MG TABLET    TAKE ONE (1) TABLET BY MOUTH EVERY DAY  Modified Medications   No medications on file  Discontinued Medications   No medications on file

## 2014-03-15 ENCOUNTER — Telehealth: Payer: Self-pay | Admitting: *Deleted

## 2014-03-15 MED ORDER — AZITHROMYCIN 250 MG PO TABS: ORAL_TABLET | ORAL | Status: DC

## 2014-03-15 NOTE — Telephone Encounter (Signed)
Due to exp to her kids and their recent strep like illness -want to change to zithromax [facility] Stop doxy and start zithromax F/u if not improving

## 2014-03-15 NOTE — Telephone Encounter (Signed)
Pt notified of Dr. Royden Purlower's comments/recommendations. Pt notified Rx sent and to f/u if no improvement

## 2014-03-15 NOTE — Telephone Encounter (Signed)
Dr. Patsy Lageropland prescribed pt doxycycline at [facility], pt has been on the abx since Sat and her sxs have not improved a lot. Pt has recently started to run a fever again and she wasn't sure if she needs to switch abx or continue taking the doxycycline, pt [facility]

## 2014-05-17 ENCOUNTER — Encounter (HOSPITAL_COMMUNITY): Payer: Self-pay | Admitting: Obstetrics and Gynecology

## 2014-06-08 IMAGING — US US OB COMP LESS 14 WK
1 series · 14 of 25 positions shown · non-contrast
Comparison: None.

CLINICAL DATA: Pregnancy.  Bleeding.  Quantitative beta HCG [DATE].

OBSTETRIC <14 WK ULTRASOUND
TECHNIQUE: Transabdominal ultrasound was performed for evaluation
of the gestation as well as the maternal uterus and adnexal
regions.

[Series 1: us ob comp less 14 wks · 14 of 25 slices shown]
[im 1/25]
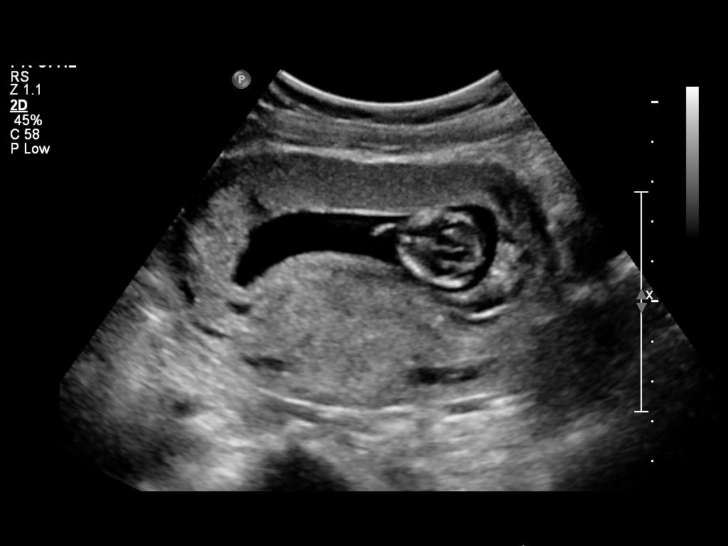
[im 3/25]
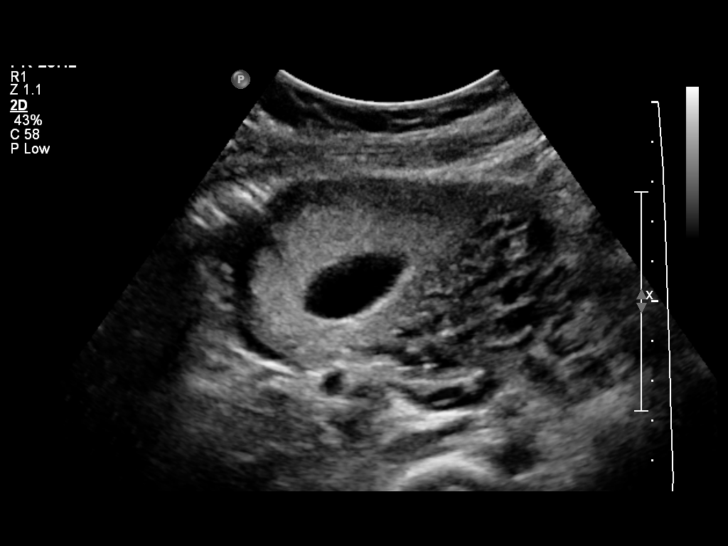
[im 5/25]
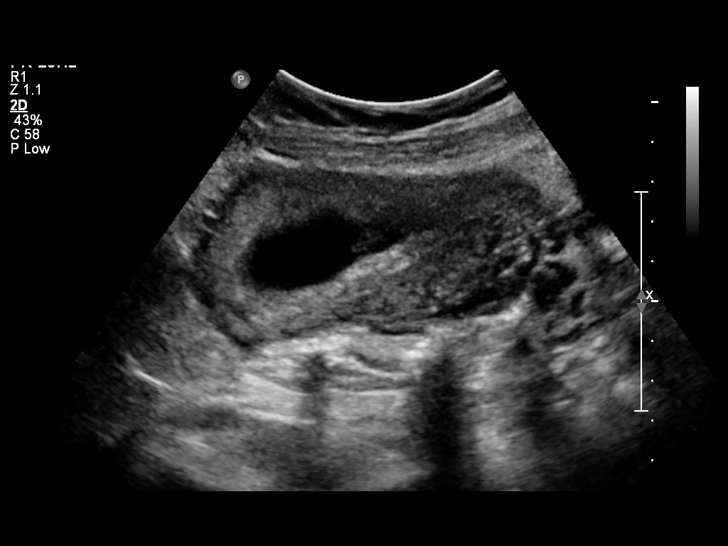
[im 7/25]
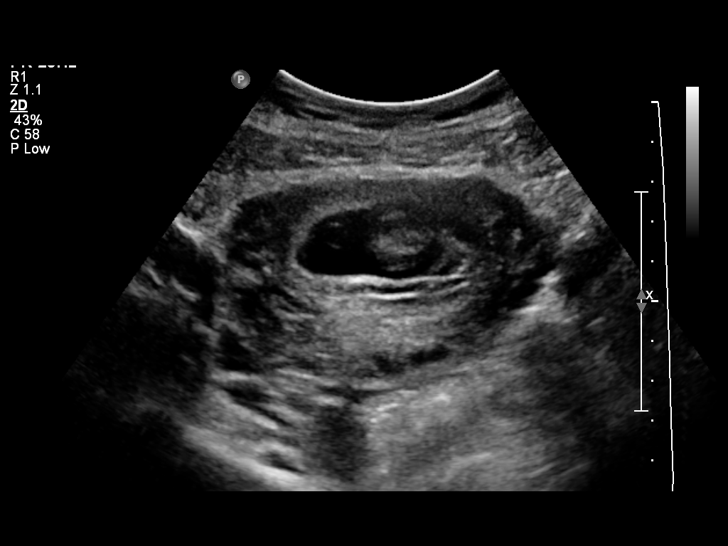
[im 9/25]
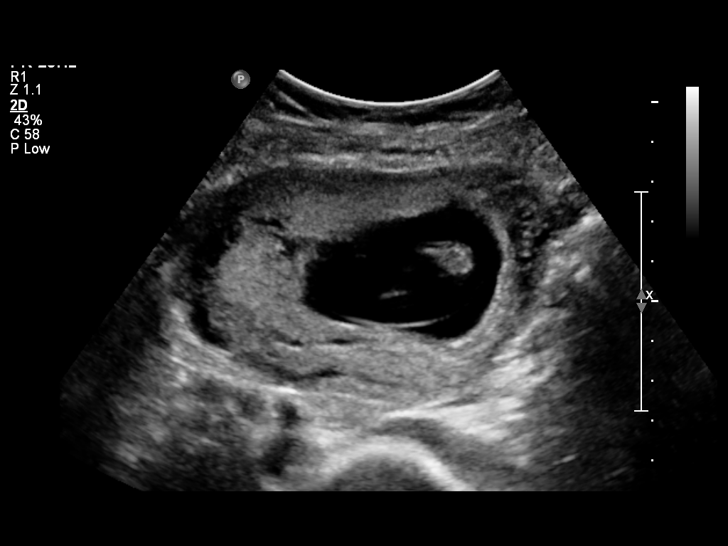
[im 10/25]
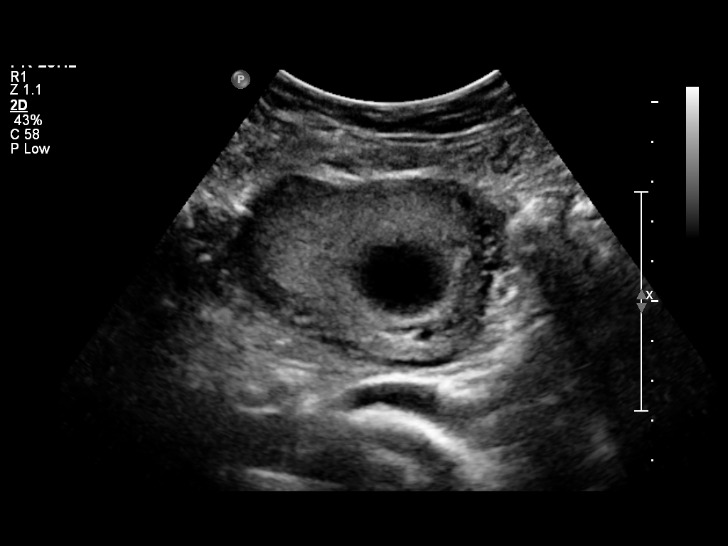
[im 12/25]
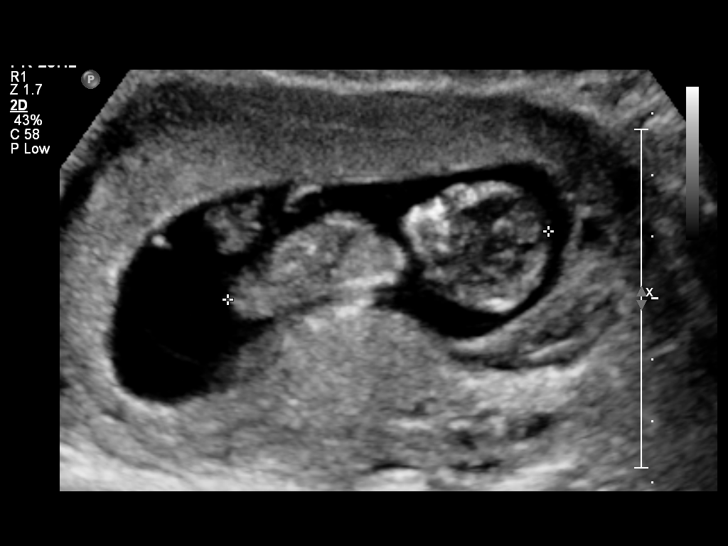
[im 14/25]
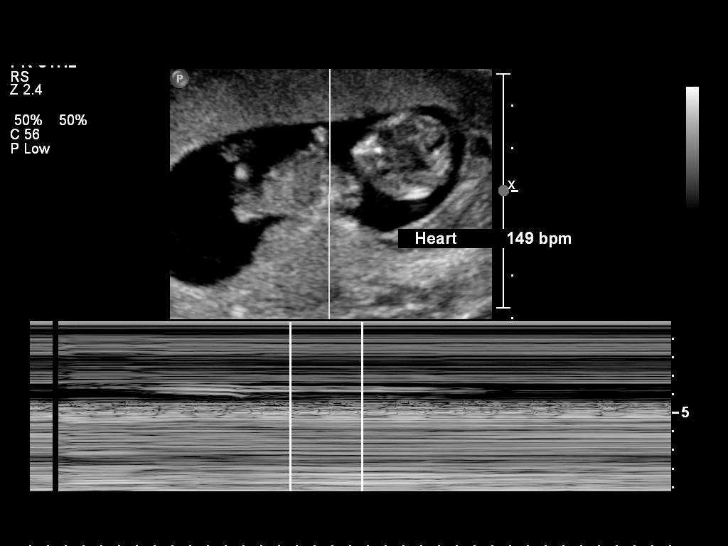
[im 16/25]
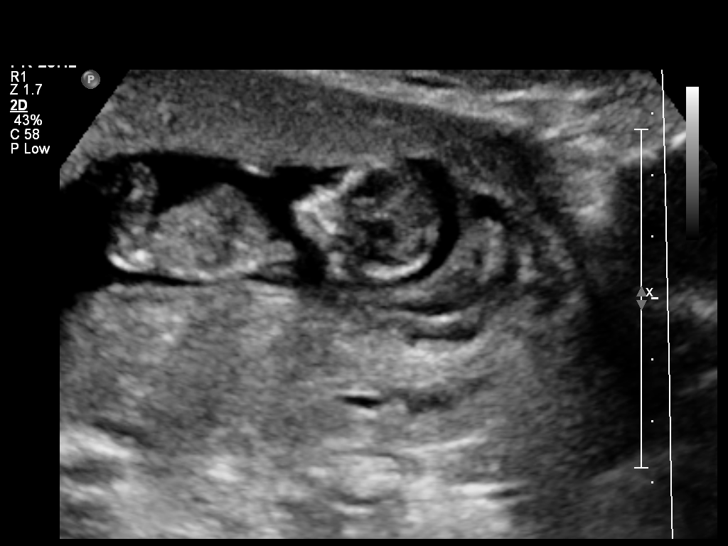
[im 17/25]
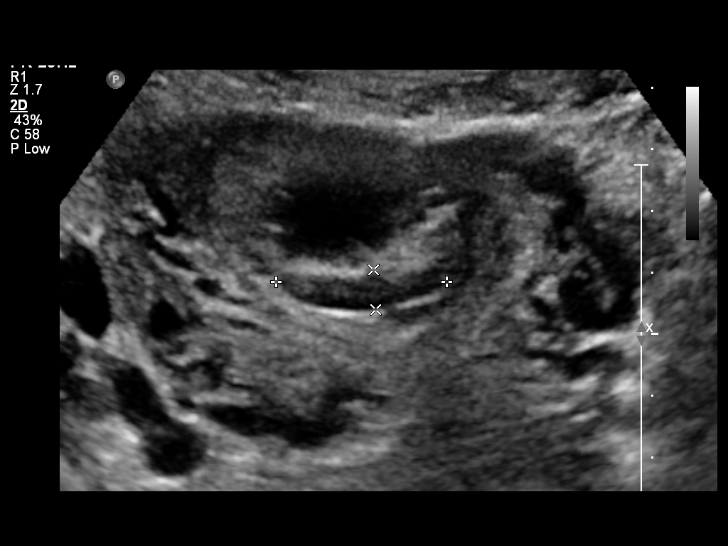
[im 19/25]
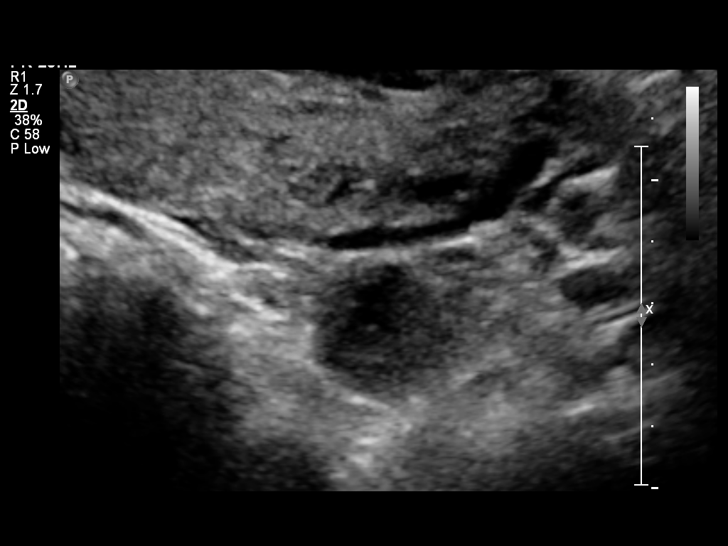
[im 21/25]
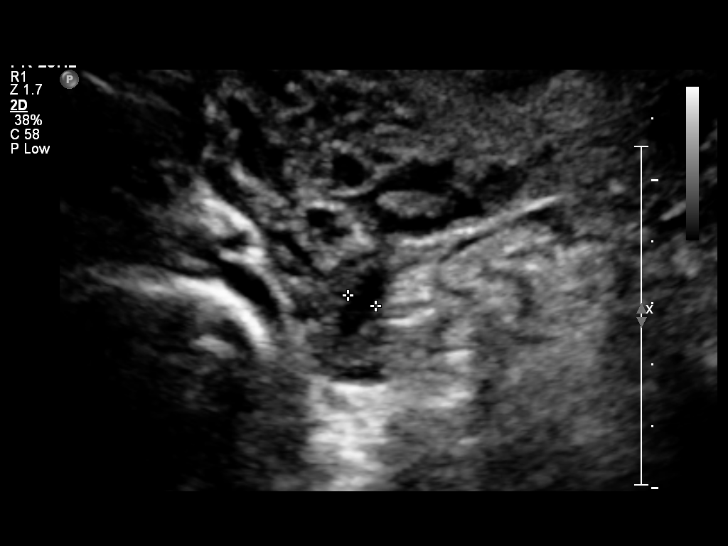
[im 23/25]
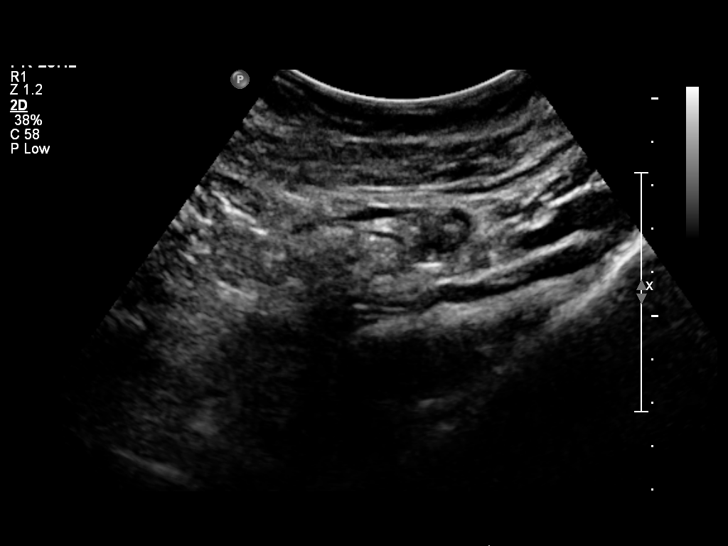
[im 25/25]
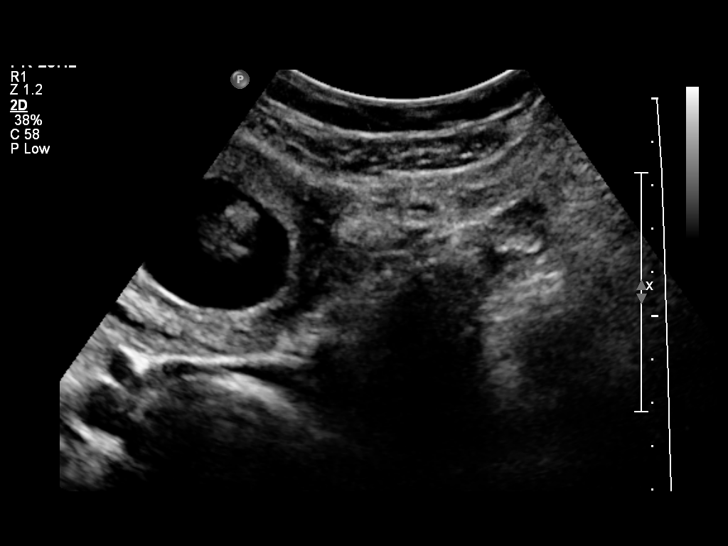

[14 of 25 positions shown; findings below may reference images not displayed]

Intrauterine gestational sac: Visualized/normal in shape.
Yolk sac: Present
Embryo: Present
Cardiac Activity: Present
Heart Rate: 149 bpm
CRL:  52.5 mm  12 w  0 d            US EDC: 06/14/2013

Maternal uterus/Adnexae:
Small subchorionic hemorrhage is present at the internal os.
Physiologic appearance of the right ovary with a corpus luteum cyst
measuring 14 mm.  The left ovary is not seen.
IMPRESSION: Bone, single intrauterine pregnancy with fetal heart tones of 149
beats per minute.  Small subchorionic hemorrhage at the internal
os.

## 2014-06-12 ENCOUNTER — Encounter: Payer: Self-pay | Admitting: Family Medicine

## 2014-06-12 ENCOUNTER — Ambulatory Visit (INDEPENDENT_AMBULATORY_CARE_PROVIDER_SITE_OTHER): Payer: 59 | Admitting: Family Medicine

## 2014-06-12 VITALS — BP 118/68 | HR 96 | Temp 98.8°F | Ht 65.0 in | Wt 137.0 lb

## 2014-06-12 DIAGNOSIS — B9789 Other viral agents as the cause of diseases classified elsewhere: Principal | ICD-10-CM

## 2014-06-12 DIAGNOSIS — R110 Nausea: Secondary | ICD-10-CM | POA: Insufficient documentation

## 2014-06-12 DIAGNOSIS — J069 Acute upper respiratory infection, unspecified: Secondary | ICD-10-CM

## 2014-06-12 MED ORDER — AZITHROMYCIN 250 MG PO TABS: ORAL_TABLET | ORAL | Status: DC

## 2014-06-12 MED ORDER — PROMETHAZINE HCL 25 MG PO TABS: 25.0000 mg | ORAL_TABLET | Freq: Three times a day (TID) | ORAL | Status: DC | PRN

## 2014-06-12 NOTE — Progress Notes (Signed)
Pre visit review using our clinic review tool, if applicable. No additional management support is needed unless otherwise documented below in the visit note. 

## 2014-06-12 NOTE — Progress Notes (Signed)
Subjective:    Patient ID: Kayla Chavez, female    DOB: 09/07/1985, 29 y.o.   MRN: [id number]  HPI Here for uri symptoms -since the weekend  Congestion and st  L side of face and ear hurt  A little nausea  Has had some chills and aches   Cough is dry  Cannot get out anything  Nasal cong without a lot of drainage (post nasal) - cannot get a lot out   She started some doxycyline on sat   No otc meds     Husband has been throwing up today  Worried about a stomach   husb sick too  Daughter has ear infection   Patient Active Problem List   Diagnosis Date Noted  . Viral URI with cough 06/12/2014  . Nausea without vomiting 06/12/2014  . Acute upper respiratory infections of unspecified site 10/24/2012  . Vaginal bleeding in pregnancy 04/08/2012  . Mucocele of lower lip 09/30/2011  . URI (upper respiratory infection) 08/02/2011  . Abdominal  pain, other specified site 07/12/2011  . Gynecological examination 01/23/2011  . Abnormal TSH 01/23/2011  . Routine general medical examination at a health care facility 01/07/2011  . Screen for STD (sexually transmitted disease) 01/07/2011  . LEUKOPENIA, MILD 01/01/2010  . BIPOLAR AFFECTIVE DISORDER 01/01/2010  . ACNE VULGARIS, FACIAL 01/01/2010  . 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 FATIGUE 11/29/2007  . ASCUS PAP 10/06/2007  . G E R D 12/14/2006  . EPISTAXIS 12/13/2006   Past Medical History  Diagnosis Date  . Abnormal Pap smear of cervix   . GERD (gastroesophageal reflux disease)   . Palpitations   . Stress reaction   . HSV-2 infection   . Complication of anesthesia     during wisdom teeth extraction, experience tachycardia with anesthesia   . Bipolar 1 disorder   . Bronchitis    Past Surgical History  Procedure Laterality Date  . Tonsillectomy    . Knee surgery    . Wisdom tooth extraction    . Cesarean section  06/09/2012    Procedure: CESAREAN SECTION;  Surgeon: Meriel Picaichard M Holland, MD;  Location: [facility];   Service: Obstetrics;  Laterality: N/A;  Primary Cesarean Section Delivery Baby Boy @ [phone], Apgars  9/9  . Cesarean section N/A 06/01/2013    Procedure: CESAREAN SECTION;  Surgeon: Meriel Picaichard M Holland, MD;  Location: [facility];  Service: Obstetrics;  Laterality: N/A;   History  Substance Use Topics  . Smoking status: Never Smoker   . Smokeless tobacco: Not on file  . Alcohol Use: Yes   Family History  Problem Relation Age of Onset  . Hypertension Father   . Hyperlipidemia Father   . Leukemia Paternal Uncle   . Cancer Paternal Grandfather     prostate  . Alcohol abuse Paternal Grandfather   . Hyperlipidemia Paternal Grandfather    Allergies  Allergen Reactions  . Naproxen Diarrhea  . Ortho Tri-Cyclen [Norgestimate-Eth Estradiol] Other (See Comments)    Heavy menses  . Other     STEROIDS:  Causes upset stomach/severe cramps/mood swings  . Duricef [Cefadroxil] Hives and Rash   Current Outpatient Prescriptions on File Prior to Visit  Medication Sig Dispense Refill  . lamoTRIgine (LAMICTAL) 150 MG tablet Take 150 mg by mouth at bedtime.     . valACYclovir (VALTREX) 500 MG tablet TAKE ONE (1) TABLET BY MOUTH EVERY DAY 90 tablet 3   No current facility-administered medications on file prior to visit.  Review of Systems Review of Systems  Constitutional: Negative for fever, appetite change, and unexpected weight change.  ENT pos for cong and facial pressure and st  Eyes: Negative for pain and visual disturbance.  Respiratory: Negative for wheeze and shortness of breath.   Cardiovascular: Negative for cp or palpitations    Gastrointestinal: Negative for abd pain , diarrhea and constipation.  Genitourinary: Negative for urgency and frequency.  Skin: Negative for pallor or rash   Neurological: Negative for weakness, light-headedness, numbness and headaches.  Hematological: Negative for adenopathy. Does not bruise/bleed easily.  Psychiatric/Behavioral: Negative for dysphoric mood. The  patient is not nervous/anxious.         Objective:   Physical Exam  Constitutional: She appears well-developed and well-nourished. No distress.  HENT:  Head: Normocephalic and atraumatic.  Right Ear: External ear normal.  Left Ear: External ear normal.  Mouth/Throat: Oropharynx is clear and moist.  Nares are injected and congested  Mild L maxillary sinus tenderness TMs are dull   Eyes: Conjunctivae and EOM are normal. Pupils are equal, round, and reactive to light. Right eye exhibits no discharge. Left eye exhibits no discharge. No scleral icterus.  Neck: Normal range of motion. Neck supple. No JVD present. No thyromegaly present.  Cardiovascular: Regular rhythm, normal heart sounds and intact distal pulses.  Exam reveals no gallop.   Pulmonary/Chest: Effort normal and breath sounds normal. No respiratory distress. She has no wheezes. She has no rales.  Abdominal: Soft. Bowel sounds are normal. She exhibits no distension and no mass. There is tenderness. There is no rebound and no guarding.  Very slt tender over whole abdomen  No rebound or guarding   Musculoskeletal: She exhibits no edema or tenderness.  Lymphadenopathy:    She has no cervical adenopathy.  Neurological: She is alert. She has normal reflexes. No cranial nerve deficit. She exhibits normal muscle tone. Coordination normal.  Skin: Skin is warm and dry. No rash noted. No erythema. No pallor.  Psychiatric: She has a normal mood and affect.          Assessment & Plan:   Problem List Items Addressed This Visit      Respiratory   Viral URI with cough - Primary    Suspect viral but pt has already started oral doxycycline at home  Given px for zpak as the doxy may be causing GI upset (or alternatively she may also be starting a GI virus) Disc symptomatic care - see instructions on AVS Phenergan for nausea Update if not starting to improve in a week or if worsening   Re assuring exam      Relevant Medications    azithromycin (ZITHROMAX) tablet     Other   Nausea without vomiting    In pt who also has uri No diarrhea  However husband is vomiting -poss early viral gastroenteritis Disc hydration strategy Phenergan 25 mg tid prn  Update if not starting to improve in a week or if worsening  -disc s/s of dehyration and will keep updated

## 2014-06-12 NOTE — Assessment & Plan Note (Signed)
In pt who also has uri No diarrhea  However husband is vomiting -poss early viral gastroenteritis Disc hydration strategy Phenergan 25 mg tid prn  Update if not starting to improve in a week or if worsening  -disc s/s of dehyration and will keep updated

## 2014-06-12 NOTE — Patient Instructions (Addendum)
Stop doxycycline   Take zpak as directed  Drink lots of fluids in sips to prevent dehydration  Band diet as tolerated  Phenergan for nausea as needed -watch out for sedation  mucinex DM for cough and congestion is helpful Rest !

## 2014-06-12 NOTE — Assessment & Plan Note (Signed)
Suspect viral but pt has already started oral doxycycline at home  Given px for zpak as the doxy may be causing GI upset (or alternatively she may also be starting a GI virus) Disc symptomatic care - see instructions on AVS Phenergan for nausea Update if not starting to improve in a week or if worsening   Re assuring exam

## 2014-07-12 ENCOUNTER — Other Ambulatory Visit: Payer: Self-pay | Admitting: Obstetrics and Gynecology

## 2014-07-16 LAB — CYTOLOGY - PAP

## 2014-08-06 ENCOUNTER — Other Ambulatory Visit: Payer: Self-pay | Admitting: Obstetrics and Gynecology

## 2014-10-11 ENCOUNTER — Encounter (HOSPITAL_COMMUNITY): Payer: Self-pay | Admitting: Obstetrics and Gynecology

## 2014-11-28 ENCOUNTER — Telehealth: Payer: Self-pay | Admitting: Family Medicine

## 2014-11-28 NOTE — Telephone Encounter (Signed)
I need her last psychiatry note -she will likely have to sign a release for that  Refill for a month to get her by while I review I generally do not treat bipolar but let me look at her info Has she been stable for a while?

## 2014-11-28 NOTE — Telephone Encounter (Signed)
Left voicemail requesting pt to call office back 

## 2014-11-28 NOTE — Telephone Encounter (Signed)
Patient would like to know if she can have a prescription for Lamictal.  She has been getting the prescription from her psychiatrist, but they do not take her insurance anymore.

## 2014-11-30 MED ORDER — LAMOTRIGINE 150 MG PO TABS: 150.0000 mg | ORAL_TABLET | Freq: Every day | ORAL | Status: DC

## 2014-11-30 NOTE — Telephone Encounter (Signed)
Thanks-I will review it

## 2014-11-30 NOTE — Telephone Encounter (Signed)
Left voicemail requesting pt to call office back 

## 2014-11-30 NOTE — Telephone Encounter (Signed)
Pt returned your call - [phone]

## 2014-11-30 NOTE — Telephone Encounter (Signed)
Pt left v/m returning call and request cb [phone].

## 2014-11-30 NOTE — Telephone Encounter (Signed)
Pt said she has been stable for the past 5 years and will come up here to sign a release to so we can get her last psychiatry note, Rx sent to pharmacy

## 2014-12-17 ENCOUNTER — Telehealth: Payer: Self-pay | Admitting: Family Medicine

## 2014-12-17 NOTE — Telephone Encounter (Signed)
Please let pt know that I rev her psychiatric notes from [facility]  It looks like her last f/u was 05/29/14 for bipolar and she was much improved with the current lamictal dose   I am fine with px for now as long as she is stable  The note mentioned she was to f/u 6 mo -is overdue so please f/u with me for her biploar when able  Refill lamictal long enough to get to that appt Thanks

## 2014-12-18 NOTE — Telephone Encounter (Signed)
Left voicemail requesting pt to call office back 

## 2014-12-21 MED ORDER — LAMOTRIGINE 150 MG PO TABS: 150.0000 mg | ORAL_TABLET | Freq: Every day | ORAL | Status: DC

## 2014-12-21 NOTE — Telephone Encounter (Signed)
Pt notified of Dr. Royden Purl comments. appt scheduled and med refilled x1

## 2014-12-27 NOTE — Telephone Encounter (Addendum)
Pt left v/m pt has been taking lamictal 200 mg; med was called in to pharmacy as lamictal 150 mg. Pt wants to know if OK to take 150 mg until sees Dr Milinda Antis on 12/28/14. Pt request cb. Will sent to Dr Milinda Antis but she is out of office today and will send to Dr Reece Agar as well.

## 2014-12-27 NOTE — Telephone Encounter (Signed)
Please change it back to the dose she is supposed to be on -thanks

## 2014-12-28 ENCOUNTER — Ambulatory Visit (INDEPENDENT_AMBULATORY_CARE_PROVIDER_SITE_OTHER): Payer: 59 | Admitting: Family Medicine

## 2014-12-28 ENCOUNTER — Encounter: Payer: Self-pay | Admitting: Family Medicine

## 2014-12-28 VITALS — BP 116/64 | HR 90 | Temp 98.2°F | Ht 65.0 in | Wt 135.5 lb

## 2014-12-28 DIAGNOSIS — F3170 Bipolar disorder, currently in remission, most recent episode unspecified: Secondary | ICD-10-CM | POA: Diagnosis not present

## 2014-12-28 MED ORDER — LAMOTRIGINE 200 MG PO TABS: 200.0000 mg | ORAL_TABLET | Freq: Every day | ORAL | Status: DC

## 2014-12-28 NOTE — Telephone Encounter (Signed)
Message not handled before pt's appt. Dr. Milinda Antis addressed at [facility]

## 2014-12-28 NOTE — Patient Instructions (Signed)
Stay on lamictal 200 mg  If you have any problems please let me know   Take care of yourself

## 2014-12-28 NOTE — Assessment & Plan Note (Signed)
Pt has new insurance that no longer pays for any psychiatrist and she cannot afford to go  Has been very stable on lamictal   since January - doing well on 200 mg (it was inc at this time) No more pregnancies planned Rev hx in detail (see HPI) Reviewed stressors/ coping techniques/symptoms/ support sources/ tx options and side effects in detail today  Will continue current dose  I inf pt that if condition changes we will have to ref her to psychiatry for treatment -otherwise continue current dose with twice yearly or more f/u  Good habits and self care >25 minutes spent in face to face time with patient, >50% spent in counselling or coordination of care

## 2014-12-28 NOTE — Progress Notes (Signed)
Subjective:    Patient ID: Kayla Chavez, female    DOB: Jan 05, 1986, 29 y.o.   MRN: [id number]  HPI Here for bipolar - cannot see [facility]- psychiatrist   (actually mood disorder not otherwise specified)  Her insurance changed and no longer takes it   Last note reviewed was Jan  Gone up on lamictal to 200 - doing very well with that   This is the only med she has been on   Pharmacy mixed up her px - 150 /wrong dose - need the new   When she has mood swings - it is not primarily anx or dep  No longer exp the downs  occ anxiety when overwhelmed /sleep deprived with children   No side eff to lamictal  Has had to change dose several times - started with 125 - inc after pregnancy   Hormones play a role   Thinks her mother has undx bipolar as well -never treated   Taking care of herself - exercises in the am and eats well and drinks fluids   Before dx she had some suicidal thoughts- was never close to carrying them out  No hospitalization  In retrospect -had anxiety for "as long as she could remember" - then depression started in teen years  Some OCD behaviors as well - much better on the medication  (was fixated on touching things/organization and keeping schedule the same every day)   No plans to have more children -had BTL   Patient Active Problem List   Diagnosis Date Noted  . Viral URI with cough 06/12/2014  . Nausea without vomiting 06/12/2014  . Acute upper respiratory infections of unspecified site 10/24/2012  . Vaginal bleeding in pregnancy 04/08/2012  . Mucocele of lower lip 09/30/2011  . URI (upper respiratory infection) 08/02/2011  . Abdominal pain, other specified site 07/12/2011  . Gynecological examination 01/23/2011  . Abnormal TSH 01/23/2011  . Routine general medical examination at a health care facility 01/07/2011  . Screen for STD (sexually transmitted disease) 01/07/2011  . LEUKOPENIA, MILD 01/01/2010  . Bipolar disorder 01/01/2010  .  ACNE VULGARIS, FACIAL 01/01/2010  . 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 FATIGUE 11/29/2007  . ASCUS PAP 10/06/2007  . G E R D 12/14/2006  . EPISTAXIS 12/13/2006   Past Medical History  Diagnosis Date  . Abnormal Pap smear of cervix   . GERD (gastroesophageal reflux disease)   . Palpitations   . Stress reaction   . HSV-2 infection   . Complication of anesthesia     during wisdom teeth extraction, experience tachycardia with anesthesia   . Bipolar 1 disorder   . Bronchitis    Past Surgical History  Procedure Laterality Date  . Tonsillectomy    . Knee surgery    . Wisdom tooth extraction    . Cesarean section  06/09/2012    Procedure: CESAREAN SECTION;  Surgeon: Meriel Pica, MD;  Location: [facility];  Service: Obstetrics;  Laterality: N/A;  Primary Cesarean Section Delivery Baby Boy @ [phone], Apgars  9/9  . Cesarean section N/A 06/01/2013    Procedure: CESAREAN SECTION;  Surgeon: Meriel Pica, MD;  Location: [facility];  Service: Obstetrics;  Laterality: N/A;   Social History  Substance Use Topics  . Smoking status: Never Smoker   . Smokeless tobacco: None  . Alcohol Use: 0.0 oz/week    0 Standard drinks or equivalent per week     Comment: occ   Family History  Problem Relation Age of Onset  . Hypertension Father   . Hyperlipidemia Father   . Leukemia Paternal Uncle   . Cancer Paternal Grandfather     prostate  . Alcohol abuse Paternal Grandfather   . Hyperlipidemia Paternal Grandfather    Allergies  Allergen Reactions  . Naproxen Diarrhea  . Ortho Tri-Cyclen [Norgestimate-Eth Estradiol] Other (See Comments)    Heavy menses  . Other     STEROIDS:  Causes upset stomach/severe cramps/mood swings  . Duricef [Cefadroxil] Hives and Rash   Current Outpatient Prescriptions on File Prior to Visit  Medication Sig Dispense Refill  . valACYclovir (VALTREX) 500 MG tablet TAKE ONE (1) TABLET BY MOUTH EVERY DAY 90 tablet 3   No current facility-administered medications on  file prior to visit.      Review of Systems Review of Systems  Constitutional: Negative for fever, appetite change, fatigue and unexpected weight change.  Eyes: Negative for pain and visual disturbance.  Respiratory: Negative for cough and shortness of breath.   Cardiovascular: Negative for cp or palpitations    Gastrointestinal: Negative for nausea, diarrhea and constipation.  Genitourinary: Negative for urgency and frequency.  Skin: Negative for pallor or rash   Neurological: Negative for weakness, light-headedness, numbness and headaches.  Hematological: Negative for adenopathy. Does not bruise/bleed easily.  Psychiatric/Behavioral: Negative for dysphoric mood. The patient occ mildly anxious if she gets overwhelmed / feels she can handle this  No SI        Objective:   Physical Exam  Constitutional: She appears well-developed and well-nourished. No distress.  HENT:  Head: Normocephalic and atraumatic.  Mouth/Throat: Oropharynx is clear and moist.  Eyes: Conjunctivae and EOM are normal. Pupils are equal, round, and reactive to light. No scleral icterus.  Neck: Normal range of motion. Neck supple.  Cardiovascular: Normal rate and regular rhythm.   Pulmonary/Chest: Effort normal and breath sounds normal.  Lymphadenopathy:    She has no cervical adenopathy.  Neurological: She is alert. She has normal reflexes. No cranial nerve deficit. She exhibits normal muscle tone. Coordination normal.  Skin: Skin is warm and dry. No rash noted. No erythema. No pallor.  Psychiatric: She has a normal mood and affect. Her speech is normal and behavior is normal. Judgment and thought content normal. Her mood appears not anxious. Her affect is not blunt, not labile and not inappropriate. Thought content is not paranoid. Cognition and memory are normal. She does not exhibit a depressed mood. She expresses no homicidal and no suicidal ideation.  Nl affect today Talkative and pleasant             Assessment & Plan:   Problem List Items Addressed This Visit    Bipolar disorder - Primary    Pt has new insurance that no longer pays for any psychiatrist and she cannot afford to go  Has been very stable on lamictal 200mg   since January - doing well on 200 mg (it was inc at this time) No more pregnancies planned Rev hx in detail (see HPI) Reviewed stressors/ coping techniques/symptoms/ support sources/ tx options and side effects in detail today  Will continue current dose  I inf pt that if condition changes we will have to ref her to psychiatry for treatment -otherwise continue current dose with twice yearly or more f/u  Good habits and self care >25 minutes spent in face to face time with patient, >50% spent in counselling or coordination of care

## 2014-12-28 NOTE — Progress Notes (Signed)
Pre visit review using our clinic review tool, if applicable. No additional management support is needed unless otherwise documented below in the visit note. 

## 2015-01-29 ENCOUNTER — Other Ambulatory Visit: Payer: Self-pay | Admitting: Family Medicine

## 2015-02-15 ENCOUNTER — Encounter: Payer: Self-pay | Admitting: Family Medicine

## 2015-02-15 ENCOUNTER — Ambulatory Visit (INDEPENDENT_AMBULATORY_CARE_PROVIDER_SITE_OTHER): Payer: 59 | Admitting: Family Medicine

## 2015-02-15 VITALS — BP 104/62 | HR 82 | Temp 98.4°F | Wt 136.2 lb

## 2015-02-15 DIAGNOSIS — J0100 Acute maxillary sinusitis, unspecified: Secondary | ICD-10-CM

## 2015-02-15 MED ORDER — GUAIFENESIN-CODEINE 100-10 MG/5ML PO SYRP: 5.0000 mL | ORAL_SOLUTION | Freq: Every evening | ORAL | Status: DC | PRN

## 2015-02-15 MED ORDER — AMOXICILLIN-POT CLAVULANATE 875-125 MG PO TABS: 1.0000 | ORAL_TABLET | Freq: Two times a day (BID) | ORAL | Status: AC

## 2015-02-15 NOTE — Progress Notes (Signed)
BP 104/62 mmHg  Pulse 82  Temp(Src) 98.4 F (36.9 C) (Oral)  Wt 136 lb 4 oz (61.803 kg)  SpO2 98%  LMP 01/31/2015  Breastfeeding? No   CC: ?sinusitis  Subjective:    Patient ID: Kayla Chavez, female    DOB: 01/31/1986, 29 y.o.   MRN: [id number]  HPI: Kayla Chavez is a 29 y.o. female presenting on 02/15/2015 for Sinusitis   5 d h/o L ear pain, progressed to chest congestion with dyspnea, dry cough, sore throat, L side headache pressure facial pain. PNdrainage. Cough keeping her up at night time.  Hasn't tried anything for this yet.  No tooth pain, wheezing, abd pain, nausea.  No sick contacts at home. Husband smokes outside. No h/o asthma.  Last 2 yrs has had bronchitis that needed ER visit/hospitalization with nebulizers.  Works with seniors.   Relevant past medical, surgical, family and social history reviewed and updated as indicated. Interim medical history since our last visit reviewed. Allergies and medications reviewed and updated. Current Outpatient Prescriptions on File Prior to Visit  Medication Sig  . lamoTRIgine (LAMICTAL) 200 MG tablet Take 1 tablet (200 mg total) by mouth daily.  . valACYclovir (VALTREX) 500 MG tablet TAKE 1 TABLET BY MOUTH DAILY   No current facility-administered medications on file prior to visit.    Review of Systems Per HPI unless specifically indicated above     Objective:    BP 104/62 mmHg  Pulse 82  Temp(Src) 98.4 F (36.9 C) (Oral)  Wt 136 lb 4 oz (61.803 kg)  SpO2 98%  LMP 01/31/2015  Breastfeeding? No  Wt Readings from Last 3 Encounters:  02/15/15 136 lb 4 oz (61.803 kg)  12/28/14 135 lb 8 oz (61.462 kg)  06/12/14 137 lb (62.143 kg)    Physical Exam  Constitutional: She appears well-developed and well-nourished. No distress.  HENT:  Head: Normocephalic and atraumatic.  Right Ear: Hearing, tympanic membrane, external ear and ear canal normal.  Left Ear: Hearing, tympanic membrane, external ear and ear  canal normal.  Nose: No mucosal edema or rhinorrhea. Right sinus exhibits no maxillary sinus tenderness and no frontal sinus tenderness. Left sinus exhibits maxillary sinus tenderness. Left sinus exhibits no frontal sinus tenderness.  Mouth/Throat: Uvula is midline, oropharynx is clear and moist and mucous membranes are normal. No oropharyngeal exudate, posterior oropharyngeal edema, posterior oropharyngeal erythema or tonsillar abscesses.  Nasal mucosal injection  Eyes: Conjunctivae and EOM are normal. Pupils are equal, round, and reactive to light. No scleral icterus.  Neck: Normal range of motion. Neck supple.  Cardiovascular: Normal rate, regular rhythm, normal heart sounds and intact distal pulses.   No murmur heard. Pulmonary/Chest: Effort normal and breath sounds normal. No respiratory distress. She has no wheezes. She has no rales.  Lymphadenopathy:    She has no cervical adenopathy.  Skin: Skin is warm and dry. No rash noted.  Nursing note and vitals reviewed.     Assessment & Plan:   Problem List Items Addressed This Visit    Acute maxillary sinusitis - Primary    Anticipate viral given 5d duration.  Recommend supportive care with ibuprofen, cheratussin cough syrup for night time (pt states tolerates codeine well), rest and fluids. Discussed reasons to fill WASP for augmentin.  Pt agrees with plan.      Relevant Medications   guaiFENesin-codeine (ROBITUSSIN AC) 100-10 MG/5ML syrup   amoxicillin-clavulanate (AUGMENTIN) 875-125 MG tablet       Follow up plan: Return if  symptoms worsen or fail to improve.

## 2015-02-15 NOTE — Progress Notes (Signed)
Pre visit review using our clinic review tool, if applicable. No additional management support is needed unless otherwise documented below in the visit note. 

## 2015-02-15 NOTE — Assessment & Plan Note (Signed)
Anticipate viral given 5d duration.  Recommend supportive care with ibuprofen, cheratussin cough syrup for night time (pt states tolerates codeine well), rest and fluids. Discussed reasons to fill WASP for augmentin.  Pt agrees with plan.

## 2015-02-15 NOTE — Patient Instructions (Signed)
You have a sinus infection, possibly still viral. Take medicine as prescribed: cheratussin for cough at night time. Push fluids and plenty of rest. Nasal saline irrigation or neti pot to help drain sinuses. Ibuprofen 400-600mg  twice daily with meals. Fill augmentin prescription if symptoms worsen after initial improvement, if fever >101, or if persistent symptoms past 10 days.

## 2015-02-21 ENCOUNTER — Ambulatory Visit (HOSPITAL_COMMUNITY): Payer: 59 | Admitting: Psychiatry

## 2015-03-18 ENCOUNTER — Telehealth: Payer: Self-pay | Admitting: Family Medicine

## 2015-03-18 NOTE — Telephone Encounter (Signed)
Question 2 - please ask her date of dx (formerly saw psychiatry) - and fill it  It  Question 5- any hx of psychosis - I don't think so but ask her to be sure  Question 9- any time off work or school- I don't know -please ask her

## 2015-03-18 NOTE — Telephone Encounter (Signed)
[employer] faxed form to be filled out In dr tower's IN BOX For review and signature

## 2015-03-19 NOTE — Telephone Encounter (Signed)
[facility] the questions and forms [facility]

## 2015-03-25 NOTE — Telephone Encounter (Signed)
Faxed paperwork 03/19/15  03/25/15 Left message asking pt to call office Please let her know paperwork has been faxed and copy here for her  Copy to pt Copy to scan Copy to file

## 2015-06-07 ENCOUNTER — Encounter: Payer: Self-pay | Admitting: Primary Care

## 2015-06-07 ENCOUNTER — Ambulatory Visit: Payer: 59 | Admitting: Family Medicine

## 2015-06-07 ENCOUNTER — Encounter: Payer: Self-pay | Admitting: Family Medicine

## 2015-06-07 ENCOUNTER — Ambulatory Visit (INDEPENDENT_AMBULATORY_CARE_PROVIDER_SITE_OTHER): Payer: 59 | Admitting: Primary Care

## 2015-06-07 VITALS — BP 116/72 | HR 104 | Temp 98.0°F | Ht 65.0 in | Wt 136.1 lb

## 2015-06-07 DIAGNOSIS — N63 Unspecified lump in breast: Secondary | ICD-10-CM | POA: Diagnosis not present

## 2015-06-07 DIAGNOSIS — N6320 Unspecified lump in the left breast, unspecified quadrant: Secondary | ICD-10-CM

## 2015-06-07 NOTE — Progress Notes (Signed)
Subjective:    Patient ID: Kayla Chavez, female    DOB: June 07, 1985, 30 y.o.   MRN: [id number]  HPI  Kayla Chavez is a 30 year old female who presents today with multiple complaints.  1) Breast Mass: Located to the left breast along with tenderness. She first noticed this mass 2-3 months ago. She's not noticed any changes in size, skin texture, or shape. The tenderness has been present for the past month. Denies family history of breast cancer.   2) Nasal congestion: Also with sore throat, ear pain. Denies cough. Her symptoms have been present for the past 2-3 days. She's not taken anything  OTC for her symptoms. Denies cough, fevers, sick contacts. She endorses history of sinusitis and reports that her symptoms will begin this way.  Review of Systems  Constitutional: Negative for fever, chills and fatigue.  HENT: Positive for congestion, ear pain, sinus pressure and sore throat.   Respiratory: Negative for cough.   Musculoskeletal: Negative for myalgias.       Past Medical History  Diagnosis Date  . Abnormal Pap smear of cervix   . GERD (gastroesophageal reflux disease)   . Palpitations   . Stress reaction   . HSV-2 infection   . Complication of anesthesia     during wisdom teeth extraction, experience tachycardia with anesthesia   . Bipolar 1 disorder (HCC)   . Bronchitis     Social History   Social History  . Marital Status: Married    Spouse Name: N/A  . Number of Children: N/A  . Years of Education: N/A   Occupational History  . Not on file.   Social History Main Topics  . Smoking status: Never Smoker   . Smokeless tobacco: Not on file  . Alcohol Use: 0.0 oz/week    0 Standard drinks or equivalent per week     Comment: occ  . Drug Use: No  . Sexual Activity: Yes   Other Topics Concern  . Not on file   Social History Narrative    Past Surgical History  Procedure Laterality Date  . Tonsillectomy    . Knee surgery    . Wisdom tooth extraction    .  Cesarean section  06/09/2012    Procedure: CESAREAN SECTION;  Surgeon: Meriel Pica, MD;  Location: [facility];  Service: Obstetrics;  Laterality: N/A;  Primary Cesarean Section Delivery Baby Boy @ [phone], Apgars  9/9  . Cesarean section N/A 06/01/2013    Procedure: CESAREAN SECTION;  Surgeon: Meriel Pica, MD;  Location: [facility];  Service: Obstetrics;  Laterality: N/A;    Family History  Problem Relation Age of Onset  . Hypertension Father   . Hyperlipidemia Father   . Leukemia Paternal Uncle   . Cancer Paternal Grandfather     prostate  . Alcohol abuse Paternal Grandfather   . Hyperlipidemia Paternal Grandfather     Allergies  Allergen Reactions  . Naproxen Diarrhea  . Ortho Tri-Cyclen [Norgestimate-Eth Estradiol] Other (See Comments)    Heavy menses  . Other     STEROIDS:  Causes upset stomach/severe cramps/mood swings  . Duricef [Cefadroxil] Hives and Rash    Current Outpatient Prescriptions on File Prior to Visit  Medication Sig Dispense Refill  . lamoTRIgine (LAMICTAL) 200 MG tablet Take 1 tablet (200 mg total) by mouth daily. 30 tablet 11  . valACYclovir (VALTREX) 500 MG tablet TAKE 1 TABLET BY MOUTH DAILY 90 tablet 3   No current facility-administered medications  on file prior to visit.    BP 116/72 mmHg  Pulse 104  Temp(Src) 98 F (36.7 C) (Oral)  Ht  (1.651 m)  Wt 136 lb 1.9 oz (61.744 kg)  BMI 22.65 kg/m2  SpO2 98%  LMP 05/19/2015    Objective:   Physical Exam  Constitutional: She appears well-nourished.  HENT:  Right Ear: Tympanic membrane and ear canal normal.  Left Ear: Tympanic membrane and ear canal normal.  Nose: Right sinus exhibits no maxillary sinus tenderness and no frontal sinus tenderness. Left sinus exhibits no maxillary sinus tenderness and no frontal sinus tenderness.  Mouth/Throat: Oropharynx is clear and moist.  Eyes: Conjunctivae are normal.  Neck: Neck supple.  Pulmonary/Chest:    Lymphadenopathy:    She has no cervical  adenopathy.  Skin: Skin is warm and dry.          Assessment & Plan:  Breast Mass:  Located to left breast, now tenderness. Exam with what feels to be fibrocystic breast changes. Mass noted to 5 o'clock and 7 o'clock positions. Tender upon exam. Will order diagnostic mammogram with [place] of left breast. Suspect this is benign.  Nasal Congestion:  Present for 2-3 days with sore throat. No fevers, cough, chills. Exam unremarkable. Does not appear ill. Will treat with supportive measures at this time. Mucinex, flonase, fluids.

## 2015-06-07 NOTE — Patient Instructions (Signed)
Your breast mass and pain is likely related to fibrocystic hormonal changes.  You will be contacted regarding scheduling your mammogram and breast ultrasound.  Please let us know if you have not heard back within one week.   Nasal Congestion: Try using Flonase (fluticasone) nasal spray. Instill 2 sprays in each nostril once daily.   Cough/Congestion: Try taking Mucinex DM. This will help loosen up the mucous in your chest. Ensure you take this medication with a full glass of water.  You may also take Robitussin or Delsym if cough develops.  Increase fluids and rest.  It was a pleasure meeting you!

## 2015-06-11 ENCOUNTER — Telehealth: Payer: Self-pay

## 2015-06-11 NOTE — Telephone Encounter (Signed)
Called patient and asked her the questions below. Patient is having headache, sore throat with a scratchy voice, cough with chest congestion, productive (but did not look at the color), and low grade fever.

## 2015-06-11 NOTE — Telephone Encounter (Signed)
Pt left /vm; pt was seen 04/05/16 and pt has tried mucinex, flonase and fluids and pt is no better and symptoms are worsening and pt request prescription. Unable to reach pt for more info.

## 2015-06-11 NOTE — Telephone Encounter (Signed)
Called and notified patient of Dr Tower's comments. Patient verbalized understanding.  

## 2015-06-11 NOTE — Telephone Encounter (Signed)
She should probably come back in for a re check to see if she is getting a bacterial sinus infection

## 2015-06-11 NOTE — Telephone Encounter (Signed)
What are her symptoms now? What's worse? Any cough? If so is it productive? What color? Any fevers?

## 2015-06-20 ENCOUNTER — Encounter: Payer: Self-pay | Admitting: Family Medicine

## 2015-07-04 LAB — HM PAP SMEAR: HM PAP: NORMAL

## 2016-01-22 ENCOUNTER — Other Ambulatory Visit: Payer: Self-pay | Admitting: Family Medicine

## 2016-01-22 NOTE — Telephone Encounter (Signed)
Please schedule a fall f/u and refill until then  

## 2016-01-22 NOTE — Telephone Encounter (Signed)
No recent/future appts., please advise  

## 2016-02-17 ENCOUNTER — Ambulatory Visit: Payer: 59 | Admitting: Family Medicine

## 2016-02-25 ENCOUNTER — Encounter: Payer: Self-pay | Admitting: Family Medicine

## 2016-02-25 ENCOUNTER — Ambulatory Visit (INDEPENDENT_AMBULATORY_CARE_PROVIDER_SITE_OTHER): Payer: 59 | Admitting: Family Medicine

## 2016-02-25 VITALS — BP 106/58 | HR 89 | Temp 98.3°F | Ht 65.0 in | Wt 134.5 lb

## 2016-02-25 DIAGNOSIS — Z13220 Encounter for screening for lipoid disorders: Secondary | ICD-10-CM

## 2016-02-25 DIAGNOSIS — N920 Excessive and frequent menstruation with regular cycle: Secondary | ICD-10-CM | POA: Insufficient documentation

## 2016-02-25 DIAGNOSIS — F3170 Bipolar disorder, currently in remission, most recent episode unspecified: Secondary | ICD-10-CM | POA: Diagnosis not present

## 2016-02-25 DIAGNOSIS — T887XXA Unspecified adverse effect of drug or medicament, initial encounter: Secondary | ICD-10-CM | POA: Diagnosis not present

## 2016-02-25 DIAGNOSIS — N921 Excessive and frequent menstruation with irregular cycle: Secondary | ICD-10-CM

## 2016-02-25 DIAGNOSIS — A6004 Herpesviral vulvovaginitis: Secondary | ICD-10-CM

## 2016-02-25 DIAGNOSIS — T50905A Adverse effect of unspecified drugs, medicaments and biological substances, initial encounter: Secondary | ICD-10-CM

## 2016-02-25 LAB — CBC WITH DIFFERENTIAL/PLATELET
BASOS ABS: 0 10*3/uL (ref 0.0–0.1)
BASOS PCT: 0.4 % (ref 0.0–3.0)
Eosinophils Absolute: 0.1 10*3/uL (ref 0.0–0.7)
Eosinophils Relative: 1.9 % (ref 0.0–5.0)
HEMATOCRIT: 38.2 % (ref 36.0–46.0)
HEMOGLOBIN: 13.1 g/dL (ref 12.0–15.0)
LYMPHS PCT: 37.1 % (ref 12.0–46.0)
Lymphs Abs: 1.9 10*3/uL (ref 0.7–4.0)
MCHC: 34.3 g/dL (ref 30.0–36.0)
MCV: 90.9 fl (ref 78.0–100.0)
MONOS PCT: 7.7 % (ref 3.0–12.0)
Monocytes Absolute: 0.4 10*3/uL (ref 0.1–1.0)
NEUTROS ABS: 2.6 10*3/uL (ref 1.4–7.7)
Neutrophils Relative %: 52.9 % (ref 43.0–77.0)
PLATELETS: 174 10*3/uL (ref 150.0–400.0)
RBC: 4.2 Mil/uL (ref 3.87–5.11)
RDW: 11.7 % (ref 11.5–15.5)
WBC: 5 10*3/uL (ref 4.0–10.5)

## 2016-02-25 LAB — COMPREHENSIVE METABOLIC PANEL
ALBUMIN: 4.6 g/dL (ref 3.5–5.2)
ALK PHOS: 66 U/L (ref 39–117)
ALT: 14 U/L (ref 0–35)
AST: 16 U/L (ref 0–37)
BILIRUBIN TOTAL: 0.5 mg/dL (ref 0.2–1.2)
BUN: 18 mg/dL (ref 6–23)
CALCIUM: 10.1 mg/dL (ref 8.4–10.5)
CO2: 32 meq/L (ref 19–32)
CREATININE: 0.75 mg/dL (ref 0.40–1.20)
Chloride: 102 mEq/L (ref 96–112)
GFR: 95.61 mL/min (ref >60.00)
Glucose, Bld: 87 mg/dL (ref 70–99)
Potassium: 4.5 mEq/L (ref 3.5–5.1)
Sodium: 138 mEq/L (ref 135–145)
Total Protein: 6.9 g/dL (ref 6.0–8.3)

## 2016-02-25 LAB — LIPID PANEL
CHOLESTEROL: 149 mg/dL (ref 0–200)
HDL: 62.2 mg/dL (ref >39.00)
LDL Cholesterol: 77 mg/dL (ref 0–99)
NonHDL: 87.17
TRIGLYCERIDES: 50 mg/dL (ref 0.0–149.0)
Total CHOL/HDL Ratio: 2
VLDL: 10 mg/dL (ref 0.0–40.0)

## 2016-02-25 MED ORDER — VALACYCLOVIR HCL 500 MG PO TABS: 500.0000 mg | ORAL_TABLET | Freq: Every day | ORAL | 3 refills | Status: DC

## 2016-02-25 MED ORDER — LAMOTRIGINE 200 MG PO TABS
200.0000 mg | ORAL_TABLET | Freq: Every day | ORAL | 11 refills | Status: DC
Start: 2016-02-25 — End: 2017-03-07

## 2016-02-25 NOTE — Patient Instructions (Signed)
Lab today  Take care of yourself  Keep seeing gyn regarding periods  No change in medicines  Get your flu shot - don't wait too long

## 2016-02-25 NOTE — Progress Notes (Signed)
Subjective:    Patient ID: Kayla Chavez, female    DOB: 1985-05-21, 30 y.o.   MRN: [id number]  HPI Here for f/u of chronic medical conditions  Doing well overall  Working a lot  In the process of moving -looking for a place around [employer] Readings from Last 3 Encounters:  02/25/16 134 lb 8 oz (61 kg)  06/07/15 136 lb 1.9 oz (61.7 kg)  02/15/15 136 lb 4 oz (61.8 kg)  taking fair care of herself  bmi is 22.3   She takes suppressive therapy for HSV  She only takes it when needed now  Outbreaks are less frequent - usually due to stress  Last pap was 3/17- [facility]   She has had 2 nl paps in a row at this point  Now her periods are lasting 3 weeks at a time  She did not tolerate birth control  BTL     She has bipolar dz - stable / no problems  On lamictal once daily  No depressive or manic episodes  Used to see psychiatry but her insurance no longer pays for a psychiatrist care  On lamictal 200 mg   No side effects   Lab Results  Component Value Date   WBC 10.4 06/01/2013   HGB 11.2 (L) 06/01/2013   HCT 32.1 (L) 06/01/2013   MCV 91.2 06/01/2013   PLT 127 (L) 06/01/2013    She has not had cholesterol screen Eats a healthy diet   Anemic in the past from menses Not getting labs from gyn   Patient Active Problem List   Diagnosis Date Noted  . Encounter for screening for lipoid disorders 02/25/2016  . Medication adverse effect 02/25/2016  . Heavy menses 02/25/2016  . Vaginal bleeding in pregnancy 04/08/2012  . Gynecological examination 01/23/2011  . Routine general medical examination at a health care facility 01/07/2011  . Screen for STD (sexually transmitted disease) 01/07/2011  . LEUKOPENIA, MILD 01/01/2010  . Bipolar disorder (HCC) 01/01/2010  . ACNE VULGARIS, FACIAL 01/01/2010  . 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 ASCUS PAP 10/06/2007  . G E R D 12/14/2006  . EPISTAXIS 12/13/2006   Past Medical History:  Diagnosis  Date  . Abnormal Pap smear of cervix   . Bipolar 1 disorder (HCC)   . Bronchitis   . Complication of anesthesia    during wisdom teeth extraction, experience tachycardia with anesthesia   . GERD (gastroesophageal reflux disease)   . HSV-2 infection   . Palpitations   . Stress reaction    Past Surgical History:  Procedure Laterality Date  . CESAREAN SECTION  06/09/2012   Procedure: CESAREAN SECTION;  Surgeon: Meriel Pica, MD;  Location: [facility];  Service: Obstetrics;  Laterality: N/A;  Primary Cesarean Section Delivery Baby Boy @ [phone], Apgars  9/9  . CESAREAN SECTION N/A 06/01/2013   Procedure: CESAREAN SECTION;  Surgeon: Meriel Pica, MD;  Location: [facility];  Service: Obstetrics;  Laterality: N/A;  . KNEE SURGERY    . TONSILLECTOMY    . WISDOM TOOTH EXTRACTION     Social History  Substance Use Topics  . Smoking status: Never Smoker  . Smokeless tobacco: Never Used  . Alcohol use 0.0 oz/week     Comment: occ   Family History  Problem Relation Age of Onset  . Hypertension Father   . Hyperlipidemia Father   . Leukemia Paternal Uncle   . Cancer Paternal Grandfather  prostate  . Alcohol abuse Paternal Grandfather   . Hyperlipidemia Paternal Grandfather    Allergies  Allergen Reactions  . Naproxen Diarrhea  . Ortho Tri-Cyclen [Norgestimate-Eth Estradiol] Other (See Comments)    Heavy menses  . Other     STEROIDS:  Causes upset stomach/severe cramps/mood swings  . Duricef [Cefadroxil] Hives and Rash   No current outpatient prescriptions on file prior to visit.   No current facility-administered medications on file prior to visit.       Review of Systems Review of Systems  Constitutional: Negative for fever, appetite change, fatigue and unexpected weight change.  Eyes: Negative for pain and visual disturbance.  Respiratory: Negative for cough and shortness of breath.   Cardiovascular: Negative for cp or palpitations    Gastrointestinal: Negative for nausea,  diarrhea and constipation.  Genitourinary: Negative for urgency and frequency.  Skin: Negative for pallor or rash  neg for excessive bruising  Neurological: Negative for weakness, light-headedness, numbness and headaches.  Hematological: Negative for adenopathy. Does not bruise/bleed easily.  Psychiatric/Behavioral: Negative for dysphoric mood. The patient is not nervous/anxious.  (mood is stable)        Objective:   Physical Exam  Constitutional: She appears well-developed and well-nourished. No distress.  Well appearing   HENT:  Head: Normocephalic and atraumatic.  Mouth/Throat: Oropharynx is clear and moist.  Eyes: Conjunctivae and EOM are normal. Pupils are equal, round, and reactive to light.  Neck: Normal range of motion. Neck supple. No JVD present. Carotid bruit is not present. No thyromegaly present.  Cardiovascular: Normal rate, regular rhythm, normal heart sounds and intact distal pulses.  Exam reveals no gallop.   Pulmonary/Chest: Effort normal and breath sounds normal. No respiratory distress. She has no wheezes. She has no rales.  No crackles  Abdominal: She exhibits no abdominal bruit.  Musculoskeletal: She exhibits no edema.  Lymphadenopathy:    She has no cervical adenopathy.  Neurological: She is alert. She has normal reflexes.  Skin: Skin is warm and dry. No rash noted.  Psychiatric: She has a normal mood and affect. Her speech is normal and behavior is normal. Thought content normal. Her mood appears not anxious. Thought content is not paranoid. Cognition and memory are normal. She does not exhibit a depressed mood. She expresses no homicidal and no suicidal ideation.  Pleasant and talkative           Assessment & Plan:   Problem List Items Addressed This Visit      Genitourinary   HERPES VULVOVAGINITIS    Less frequent outbreaks-only takes valtrex prn 500 daily for 5-7 d as needed Refilled med       Relevant Medications   valACYclovir (VALTREX) 500  MG tablet     Other   Bipolar disorder (HCC)    Stable and managed by me now that her ins will not pay for psychiatry visits Doing very well on lamictal 200 daily w/o side effects and with good control of mood (no dep or manic episodes)  Disc imp of compliance with med and good self care Refilled  Cbc and cmet today      Encounter for screening for lipoid disorders   Relevant Orders   Lipid panel   Heavy menses    Heavy and long menses  Pt will f/u with gyn for this Has had BTL and intol of OC  Was told with hx that ablation may not be possible ? If IUD or hysterectomy would be advised She will  f/u with gyn      Medication adverse effect    Pt takes lamictal  Has not had labs since starting it - checking cbc with diff and cmet today  No complaints       Relevant Orders   CBC with Differential/Platelet   Comprehensive metabolic panel    Other Visit Diagnoses   None.

## 2016-02-25 NOTE — Assessment & Plan Note (Addendum)
Stable and managed by me now that her ins will not pay for psychiatry visits Reviewed stressors/ coping techniques/symptoms/ support sources/ tx options and side effects in detail today  Doing very well on lamictal 200 daily w/o side effects and with good control of mood (no dep or manic episodes)  Disc imp of compliance with med and good self care Refilled  Cbc and cmet today

## 2016-02-25 NOTE — Assessment & Plan Note (Signed)
Pt takes lamictal  Has not had labs since starting it - checking cbc with diff and cmet today  No complaints

## 2016-02-25 NOTE — Assessment & Plan Note (Signed)
Less frequent outbreaks-only takes valtrex prn 500 daily for 5-7 d as needed Refilled med

## 2016-02-25 NOTE — Assessment & Plan Note (Signed)
Heavy and long menses  Pt will f/u with gyn for this Has had BTL and intol of OC  Was told with hx that ablation may not be possible ? If IUD or hysterectomy would be advised She will f/u with gyn

## 2016-02-25 NOTE — Progress Notes (Signed)
Pre visit review using our clinic review tool, if applicable. No additional management support is needed unless otherwise documented below in the visit note. 

## 2016-02-26 ENCOUNTER — Encounter: Payer: Self-pay | Admitting: *Deleted

## 2016-05-27 ENCOUNTER — Encounter: Payer: Self-pay | Admitting: Family Medicine

## 2016-05-27 ENCOUNTER — Ambulatory Visit (INDEPENDENT_AMBULATORY_CARE_PROVIDER_SITE_OTHER): Payer: 59 | Admitting: Family Medicine

## 2016-05-27 DIAGNOSIS — K529 Noninfective gastroenteritis and colitis, unspecified: Secondary | ICD-10-CM

## 2016-05-27 MED ORDER — ONDANSETRON HCL 4 MG PO TABS: 4.0000 mg | ORAL_TABLET | Freq: Three times a day (TID) | ORAL | 0 refills | Status: DC | PRN

## 2016-05-27 NOTE — Progress Notes (Signed)
Sx started about 2 days ago.  Started with nausea.  Then vomiting.  Diarrhea in the meantime, every few hours, with urgency.  No blood in stools.  Last fever was yesterday.  No rash.  Still with unsettled/nausea sensation.  Lightheaded.  Still making urine, not dark.  Known sick contact at work with norovirus.    Meds, vitals, and allergies reviewed.   ROS: Per HPI unless specifically indicated in ROS section   GEN: nad, alert and oriented HEENT: mucous membranes moist NECK: supple w/o LA CV: rrr.  no murmur PULM: ctab, no inc wob ABD: soft, +bs, minimally ttp w/o rebound.  EXT: no edema SKIN: no acute rash

## 2016-05-27 NOTE — Progress Notes (Signed)
Pre visit review using our clinic review tool, if applicable. No additional management support is needed unless otherwise documented below in the visit note. 

## 2016-05-27 NOTE — Patient Instructions (Signed)
Presumed norovirus.  Rest and fluids.  Avoid dairy.  zofran if needed.   Take care.  Glad to see you.

## 2016-05-28 DIAGNOSIS — K529 Noninfective gastroenteritis and colitis, unspecified: Secondary | ICD-10-CM | POA: Insufficient documentation

## 2016-05-28 NOTE — Assessment & Plan Note (Signed)
Presumed norovirus.  Rest and fluids.  Avoid dairy.  zofran if needed.   Okay for outpatient f/u.  Routine cautions given to patient.

## 2016-07-16 ENCOUNTER — Telehealth: Payer: Self-pay

## 2016-07-16 NOTE — Telephone Encounter (Signed)
I spoke with pt and she just [facility]. [facility] rx [facility] on 07/14/16. Pt said she had not gotten to pharmacy to pick up med until this morning and nothing further needed.FYI to Dr Milinda Antisower.

## 2016-07-16 NOTE — Telephone Encounter (Signed)
PLEASE NOTE: All timestamps contained within this report are represented as [place] Standard Time. CONFIDENTIALTY NOTICE: This fax transmission is intended only for the addressee. It contains information that is legally privileged, confidential or otherwise protected from use or disclosure. If you are not the intended recipient, you are strictly prohibited from reviewing, disclosing, copying using or disseminating any of this information or taking any action in reliance on or regarding this information. If you have received this fax in error, please notify us immediately by telephone so that we can arrange for its return to us. Phone: [phone], Toll-Free: [phone], Fax: [phone] Page: 1 of 1 Call Id: [id number] [facility] - Client TELEPHONE ADVICE RECORD [facility] Patient Name: Kayla Chavez Gender: Female DOB: 06/27/1985 Age: [age] Y 8 M 7 D Return Phone Number: [phone] (Primary), [phone] (Secondary) City[place]: [place] [id number] Client [facility] - Client Client Site [facility] Physician Tower, Idamae SchullerMarne - MD Who Is Calling Patient / Member / Family / Caregiver Call Type Triage / Clinical Relationship To Patient Self Return Phone Number [phone](336) [phone] (Primary) Chief Complaint Prescription Refill or Medication Request (non symptomatic) Reason for Call Medication Question / Request Initial Comment Caller wants to know if she can get her Rx filled or if she can wait until tomorrow to take it. Today is day 2 without it. Lamictal. Denies symptoms No Triage Reason Patient declined Nurse Assessment Nurse: Hammonds, RN, Lissa Date/Time (Eastern Time): 07/15/2016 8:04:28 PM Confirm and document reason for call. If symptomatic, describe symptoms. ---Caller wants to know if she can get her Rx filled or if she can wait until tomorrow to take it. Today is day 2  without it. Lamictal. I am having significant anxiety. She is taking it for mood. Select reason for no triage. ---Patient declined Please document clinical information provided and list any resource used. ---Caller wants to know if she can get her Rx filled or if she can wait until tomorrow to take it. Today is day 2 without it. Lamictal. I am having significant anxiety. She is taking it for mood. Guidelines Guideline Title Affirmed Question Disp. Time Lamount Cohen(Eastern Time) Disposition Final User 07/15/2016 8:07:57 PM Clinical Call Yes Hammonds, RN, Alta CorningLissa

## 2016-08-19 DIAGNOSIS — R509 Fever, unspecified: Secondary | ICD-10-CM | POA: Diagnosis not present

## 2016-08-19 DIAGNOSIS — J988 Other specified respiratory disorders: Secondary | ICD-10-CM | POA: Diagnosis not present

## 2016-12-10 DIAGNOSIS — Z6821 Body mass index (BMI) 21.0-21.9, adult: Secondary | ICD-10-CM | POA: Diagnosis not present

## 2016-12-10 DIAGNOSIS — Z01419 Encounter for gynecological examination (general) (routine) without abnormal findings: Secondary | ICD-10-CM | POA: Diagnosis not present

## 2017-02-26 ENCOUNTER — Encounter: Payer: Self-pay | Admitting: Family Medicine

## 2017-02-26 ENCOUNTER — Ambulatory Visit (INDEPENDENT_AMBULATORY_CARE_PROVIDER_SITE_OTHER): Payer: BLUE CROSS/BLUE SHIELD | Admitting: Family Medicine

## 2017-02-26 ENCOUNTER — Ambulatory Visit: Payer: Self-pay | Admitting: Internal Medicine

## 2017-02-26 ENCOUNTER — Ambulatory Visit: Payer: 59 | Admitting: Internal Medicine

## 2017-02-26 VITALS — BP 100/80 | HR 97 | Temp 97.6°F | Ht 65.0 in | Wt 130.8 lb

## 2017-02-26 DIAGNOSIS — R059 Cough, unspecified: Secondary | ICD-10-CM

## 2017-02-26 DIAGNOSIS — R05 Cough: Secondary | ICD-10-CM

## 2017-02-26 MED ORDER — GUAIFENESIN-CODEINE 100-10 MG/5ML PO SOLN: 10.0000 mL | Freq: Three times a day (TID) | ORAL | 0 refills | Status: DC | PRN

## 2017-02-26 MED ORDER — IPRATROPIUM BROMIDE 0.06 % NA SOLN: 2.0000 | Freq: Four times a day (QID) | NASAL | 0 refills | Status: DC

## 2017-02-26 NOTE — Progress Notes (Signed)
    Subjective:  Kayla Chavez is a 31 y.o. female who presents today with a chief complaint of cough.   HPI:  Cough, Acute issue Symptoms started yesterday.  Associated with some chest congestion, sore throat, chills, and some body aches.  Her son is sick.  Otherwise no other known sick contacts.  Symptoms have worsened slightly today.  She took an Alka-Seltzer which helps some.  No other fevers tried.  Cough is keeping her awake at night.  ROS: Per HPI  PMH: Smoking history reviewed.  Never smoker.  Objective:  Physical Exam: BP 100/80   Pulse 97   Temp 97.6 F (36.4 C) (Oral)   Ht 5\' 5"  (1.651 m)   Wt 130 lb 12.8 oz (59.3 kg)   SpO2 99%   BMI 21.77 kg/m   Gen: NAD, resting comfortably HEENT: Right TM with clear effusion.  Left TM clear.  Oropharynx clear.  Maxillary and frontal sinuses transilluminate normally.  No lymphadenopathy.  Moist mucous membranes. CV: RRR with no murmurs appreciated Pulm: NWOB, CTAB with no crackles, wheezes, or rhonchi MSK: No edema, cyanosis, or clubbing noted Skin: Warm, dry Neuro: Grossly normal, moves all extremities Psych: Normal affect and thought content  Assessment/Plan:  Cough, acute issue No signs of bacterial infection, likely viral etiology.  Given her severe cough that is keeping her awake at night, will give guaifenesin-codeine cough syrup.  Also start Atrovent nasal spray.  Advised patient to use Tylenol and/or Motrin as needed for pain and fever.  Encouraged good oral hydration.  Return precautions reviewed.  Follow-up as needed.  Katina Degreealeb M. Jimmey RalphParker, MD 02/26/2017 5:10 PM

## 2017-02-26 NOTE — Patient Instructions (Signed)
Upper Respiratory Infection, Adult Most upper respiratory infections (URIs) are caused by a virus. A URI affects the nose, throat, and upper air passages. The most common type of URI is often called "the common cold." Follow these instructions at home:  Take medicines only as told by your doctor.  Gargle warm saltwater or take cough drops to comfort your throat as told by your doctor.  Use a warm mist humidifier or inhale steam from a shower to increase air moisture. This may make it easier to breathe.  Drink enough fluid to keep your pee (urine) clear or pale yellow.  Eat soups and other clear broths.  Have a healthy diet.  Rest as needed.  Go back to work when your fever is gone or your doctor says it is okay. ? You may need to stay home longer to avoid giving your URI to others. ? You can also wear a face mask and wash your hands often to prevent spread of the virus.  Use your inhaler more if you have asthma.  Do not use any tobacco products, including cigarettes, chewing tobacco, or electronic cigarettes. If you need help quitting, ask your doctor. Contact a doctor if:  You are getting worse, not better.  Your symptoms are not helped by medicine.  You have chills.  You are getting more short of breath.  You have brown or red mucus.  You have yellow or brown discharge from your nose.  You have pain in your face, especially when you bend forward.  You have a fever.  You have puffy (swollen) neck glands.  You have pain while swallowing.  You have white areas in the back of your throat. Get help right away if:  You have very bad or constant: ? Headache. ? Ear pain. ? Pain in your forehead, behind your eyes, and over your cheekbones (sinus pain). ? Chest pain.  You have long-lasting (chronic) lung disease and any of the following: ? Wheezing. ? Long-lasting cough. ? Coughing up blood. ? A change in your usual mucus.  You have a stiff neck.  You have  changes in your: ? Vision. ? Hearing. ? Thinking. ? Mood. This information is not intended to replace advice given to you by your health care provider. Make sure you discuss any questions you have with your health care provider. Document Released: 10/07/2007 Document Revised: 12/22/2015 Document Reviewed: 07/26/2013 Elsevier Interactive Patient Education  2018 [employer]vier Inc.  

## 2017-03-07 ENCOUNTER — Other Ambulatory Visit: Payer: Self-pay | Admitting: Family Medicine

## 2017-03-08 NOTE — Telephone Encounter (Signed)
Please schedule a winter f/u and refill until then 

## 2017-03-08 NOTE — Telephone Encounter (Signed)
Pt was seen by another provider for a recent acute appt but no recent f/u or CPE, please advise

## 2017-03-09 NOTE — Telephone Encounter (Signed)
appt scheduled and med refilled 

## 2017-04-05 ENCOUNTER — Ambulatory Visit: Payer: BLUE CROSS/BLUE SHIELD | Admitting: Family Medicine

## 2017-04-05 ENCOUNTER — Encounter: Payer: Self-pay | Admitting: Family Medicine

## 2017-04-05 VITALS — BP 126/64 | HR 89 | Temp 98.9°F | Ht 65.0 in | Wt 127.2 lb

## 2017-04-05 DIAGNOSIS — Z13220 Encounter for screening for lipoid disorders: Secondary | ICD-10-CM

## 2017-04-05 DIAGNOSIS — F3170 Bipolar disorder, currently in remission, most recent episode unspecified: Secondary | ICD-10-CM

## 2017-04-05 DIAGNOSIS — N921 Excessive and frequent menstruation with irregular cycle: Secondary | ICD-10-CM

## 2017-04-05 DIAGNOSIS — Z23 Encounter for immunization: Secondary | ICD-10-CM | POA: Diagnosis not present

## 2017-04-05 DIAGNOSIS — Z8742 Personal history of other diseases of the female genital tract: Secondary | ICD-10-CM

## 2017-04-05 DIAGNOSIS — Z87898 Personal history of other specified conditions: Secondary | ICD-10-CM

## 2017-04-05 DIAGNOSIS — A6004 Herpesviral vulvovaginitis: Secondary | ICD-10-CM

## 2017-04-05 LAB — COMPREHENSIVE METABOLIC PANEL
ALBUMIN: 4.7 g/dL (ref 3.5–5.2)
ALK PHOS: 54 U/L (ref 39–117)
ALT: 16 U/L (ref 0–35)
AST: 19 U/L (ref 0–37)
BILIRUBIN TOTAL: 0.6 mg/dL (ref 0.2–1.2)
BUN: 13 mg/dL (ref 6–23)
CO2: 29 mEq/L (ref 19–32)
Calcium: 9.8 mg/dL (ref 8.4–10.5)
Chloride: 103 mEq/L (ref 96–112)
Creatinine, Ser: 0.73 mg/dL (ref 0.40–1.20)
GFR: 97.95 mL/min (ref >60.00)
Glucose, Bld: 79 mg/dL (ref 70–99)
POTASSIUM: 3.9 meq/L (ref 3.5–5.1)
Sodium: 139 mEq/L (ref 135–145)
TOTAL PROTEIN: 7 g/dL (ref 6.0–8.3)

## 2017-04-05 LAB — LIPID PANEL
CHOLESTEROL: 148 mg/dL (ref 0–200)
HDL: 63.1 mg/dL (ref >39.00)
LDL Cholesterol: 75 mg/dL (ref 0–99)
NonHDL: 84.83
Total CHOL/HDL Ratio: 2
Triglycerides: 49 mg/dL (ref 0.0–149.0)
VLDL: 9.8 mg/dL (ref 0.0–40.0)

## 2017-04-05 LAB — CBC WITH DIFFERENTIAL/PLATELET
Basophils Absolute: 0 10*3/uL (ref 0.0–0.1)
Basophils Relative: 0.7 % (ref 0.0–3.0)
EOS PCT: 1.5 % (ref 0.0–5.0)
Eosinophils Absolute: 0.1 10*3/uL (ref 0.0–0.7)
HEMATOCRIT: 39.3 % (ref 36.0–46.0)
HEMOGLOBIN: 13.1 g/dL (ref 12.0–15.0)
LYMPHS PCT: 44.3 % (ref 12.0–46.0)
Lymphs Abs: 1.6 10*3/uL (ref 0.7–4.0)
MCHC: 33.4 g/dL (ref 30.0–36.0)
MCV: 94.3 fl (ref 78.0–100.0)
Monocytes Absolute: 0.3 10*3/uL (ref 0.1–1.0)
Monocytes Relative: 9.1 % (ref 3.0–12.0)
Neutro Abs: 1.6 10*3/uL (ref 1.4–7.7)
Neutrophils Relative %: 44.4 % (ref 43.0–77.0)
Platelets: 181 10*3/uL (ref 150.0–400.0)
RBC: 4.17 Mil/uL (ref 3.87–5.11)
RDW: 12.3 % (ref 11.5–15.5)
WBC: 3.7 10*3/uL — AB (ref 4.0–10.5)

## 2017-04-05 LAB — TSH: TSH: 1.3 u[IU]/mL (ref 0.35–4.50)

## 2017-04-05 MED ORDER — LAMOTRIGINE 200 MG PO TABS: 200.0000 mg | ORAL_TABLET | Freq: Every day | ORAL | 11 refills | Status: DC

## 2017-04-05 NOTE — Assessment & Plan Note (Signed)
Recent nl pap per pt  Sent for report

## 2017-04-05 NOTE — Assessment & Plan Note (Signed)
Infrequent flares Does not need a refill of valtrex at this time

## 2017-04-05 NOTE — Assessment & Plan Note (Signed)
Lipid panel today  Good diet and health habits

## 2017-04-05 NOTE — Patient Instructions (Signed)
Labs today  Flu shot today  Try to eat a healthy diet and get exercise when you can  Get outdoors when you can -important for mood Take care

## 2017-04-05 NOTE — Assessment & Plan Note (Signed)
Doing well with no significant episodes of dep or mania  On lamictal over 5 y  No sig side effects  Lab today  Refills for the year  Reviewed stressors/ coping techniques/symptoms/ support sources/ tx options and side effects in detail today  Good health habits- enc getting outdoors when able for mood as well as exercise

## 2017-04-05 NOTE — Assessment & Plan Note (Signed)
This is ongoing-pt sees gyn  Cbc today to r/o anemia  She is considering hysterectomy (not candidate for ablation)

## 2017-04-05 NOTE — Progress Notes (Signed)
Subjective:    Patient ID: Kayla Chavez, female    DOB: 07/13/1985, 31 y.o.   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 Encounter for screening for lipoid disorders 02/25/2016  . Medication adverse effect 02/25/2016  . Heavy menses 02/25/2016  . Gynecological examination 01/23/2011  . Routine general medical examination at a health care facility 01/07/2011  . Screen for STD (sexually transmitted disease) 01/07/2011  . LEUKOPENIA, MILD 01/01/2010  . Bipolar disorder (HCC) 01/01/2010  . ACNE VULGARIS, FACIAL 01/01/2010  . 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 ASCUS PAP 10/06/2007  . G E R D 12/14/2006  . EPISTAXIS 12/13/2006   Past Medical History:  Diagnosis Date  . Abnormal Pap smear of cervix   . Bipolar 1 disorder (HCC)   . Bronchitis   . Complication of anesthesia    during wisdom teeth extraction, experience tachycardia with anesthesia   . GERD (gastroesophageal reflux disease)   . HSV-2 infection   . Palpitations   . Stress reaction    Past Surgical History:  Procedure  Laterality Date  . CESAREAN SECTION  06/09/2012   Procedure: CESAREAN SECTION;  Surgeon: Meriel Picaichard M Holland, MD;  Location: [facility];  Service: Obstetrics;  Laterality: N/A;  Primary Cesarean Section Delivery Baby Boy @ [phone], Apgars  9/9  . CESAREAN SECTION N/A 06/01/2013   Procedure: CESAREAN SECTION;  Surgeon: Meriel Picaichard M Holland, MD;  Location: [facility];  Service: Obstetrics;  Laterality: N/A;  . KNEE SURGERY    . TONSILLECTOMY    . WISDOM TOOTH EXTRACTION     Social History   Tobacco Use  . Smoking status: Never Smoker  . Smokeless tobacco: Never Used  Substance Use Topics  . Alcohol use: Yes    Alcohol/week: 0.0 oz    Comment: occ  . Drug use: No   Family History  Problem Relation Age of Onset  . Hypertension Father   . Hyperlipidemia Father   . Leukemia Paternal Uncle   . Cancer Paternal Grandfather        prostate  . Alcohol abuse Paternal Grandfather   . Hyperlipidemia Paternal Grandfather    Allergies  Allergen Reactions  . Naproxen Diarrhea  . Ortho Tri-Cyclen [Norgestimate-Eth Estradiol] Other (See Comments)    Heavy menses  . Other     STEROIDS:  Causes upset stomach/severe cramps/mood swings  . Duricef [  Cefadroxil] Hives and Rash   Current Outpatient Medications on File Prior to Visit  Medication Sig Dispense Refill  . ipratropium (ATROVENT) 0.06 % nasal spray Place 2 sprays into both nostrils 4 (four) times daily. (Patient taking differently: Place 2 sprays into both nostrils daily as needed. ) 15 mL 0  . valACYclovir (VALTREX) 500 MG tablet Take 1 tablet (500 mg total) by mouth daily. For 5-7 days as needed for outbreak 15 tablet 3   No current facility-administered medications on file prior to visit.     Review of Systems  Constitutional: Positive for fatigue. Negative for activity change, appetite change, fever and unexpected weight change.  HENT: Negative for congestion, ear pain, rhinorrhea, sinus pressure and sore throat.   Eyes: Negative for pain, redness  and visual disturbance.  Respiratory: Negative for cough, shortness of breath and wheezing.   Cardiovascular: Negative for chest pain and palpitations.  Gastrointestinal: Negative for abdominal pain, blood in stool, constipation and diarrhea.  Endocrine: Negative for polydipsia and polyuria.  Genitourinary: Positive for menstrual problem. Negative for dysuria, frequency, pelvic pain and urgency.  Musculoskeletal: Negative for arthralgias, back pain and myalgias.  Skin: Negative for pallor and rash.  Allergic/Immunologic: Negative for environmental allergies.  Neurological: Negative for dizziness, syncope and headaches.  Hematological: Negative for adenopathy. Does not bruise/bleed easily.  Psychiatric/Behavioral: Negative for decreased concentration, dysphoric mood and suicidal ideas. The patient is not nervous/anxious.        Objective:   Physical Exam  Constitutional: She appears well-developed and well-nourished. No distress.  Slim and well appearing   HENT:  Head: Normocephalic and atraumatic.  Mouth/Throat: Oropharynx is clear and moist.  Eyes: Conjunctivae and EOM are normal. Pupils are equal, round, and reactive to light. No scleral icterus.  Neck: Normal range of motion. Neck supple. No JVD present. Carotid bruit is not present. No thyromegaly present.  Cardiovascular: Normal rate, regular rhythm, normal heart sounds and intact distal pulses. Exam reveals no gallop.  Pulmonary/Chest: Effort normal and breath sounds normal. No respiratory distress. She has no wheezes. She has no rales.  No crackles  Abdominal: She exhibits no abdominal bruit.  Musculoskeletal: She exhibits no edema.  Lymphadenopathy:    She has no cervical adenopathy.  Neurological: She is alert. She has normal reflexes. She displays no tremor. No cranial nerve deficit. She exhibits normal muscle tone. Coordination normal.  Skin: Skin is warm and dry. No rash noted.  Psychiatric: She has a normal mood and  affect. Her speech is normal and behavior is normal. Thought content normal. Her mood appears not anxious. Her affect is not blunt and not labile. Thought content is not delusional. She does not exhibit a depressed mood. She expresses no suicidal plans and no homicidal plans.          Assessment & Plan:   Problem List Items Addressed This Visit      Genitourinary   HERPES VULVOVAGINITIS    Infrequent flares Does not need a refill of valtrex at this time         Other   Bipolar disorder (HCC) - Primary    Doing well with no significant episodes of dep or mania  On lamictal over 5 y  No sig side effects  Lab today  Refills for the year  Reviewed stressors/ coping techniques/symptoms/ support sources/ tx options and side effects in detail today  Good health habits- enc getting outdoors when able for mood as well as exercise  Relevant Orders   Comprehensive metabolic panel   TSH   Encounter for screening for lipoid disorders    Lipid panel today  Good diet and health habits       Relevant Orders   Lipid panel   H/O abnormal cervical Papanicolaou smear   Heavy menses    This is ongoing-pt sees gyn  Cbc today to r/o anemia  She is considering hysterectomy (not candidate for ablation)       Relevant Orders   CBC with Differential/Platelet

## 2017-04-06 ENCOUNTER — Encounter: Payer: Self-pay | Admitting: *Deleted

## 2017-05-07 ENCOUNTER — Ambulatory Visit: Payer: BLUE CROSS/BLUE SHIELD | Admitting: Family Medicine

## 2017-05-07 ENCOUNTER — Encounter: Payer: Self-pay | Admitting: Family Medicine

## 2017-05-07 VITALS — BP 108/66 | HR 84 | Temp 99.2°F | Ht 65.0 in | Wt 130.8 lb

## 2017-05-07 DIAGNOSIS — L989 Disorder of the skin and subcutaneous tissue, unspecified: Secondary | ICD-10-CM

## 2017-05-07 DIAGNOSIS — F3170 Bipolar disorder, currently in remission, most recent episode unspecified: Secondary | ICD-10-CM | POA: Diagnosis not present

## 2017-05-07 MED ORDER — LAMOTRIGINE 100 MG PO TABS: ORAL_TABLET | ORAL | 11 refills | Status: DC

## 2017-05-07 NOTE — Patient Instructions (Signed)
Take your lamictal down to 150 mg once daily (1 1/2 of the 100s)  After 2 weeks -if improved stay at that dose  If not entirely improved-go down to 100 mg daily and stay there until you see psychiatry   If your mood changes / depression/anxiety/ mania - alert us   We will refer you to psychiatry and dermatology

## 2017-05-07 NOTE — Progress Notes (Signed)
Subjective:    Patient ID: Kayla Chavez, female    DOB: 28-Jan-1986, 32 y.o.   MRN: [id number]  HPI Here for a spot to check on her back  It has been there a "while" -at least a few months (thought it was a pimple)  Itches and gets inflamed  Drives her crazy   Concerned re a side effect of lamictal  (she takes 200 mg daily)    She started having an involuntary head movement and looked into side effects  Really tired in general  Trouble concentrating  Word finding  Also dysmenorrhea   She would really like to come off of it  When diagnosed with bipolar - was going through severe stressors and had depression symptoms (with family hx of bipolar)  Took MMPI and IQ testing   She used to see psychiatry - and they no longer took her insurance  [facility] previously     [employer] Readings from Last 3 Encounters:  05/07/17 130 lb 12 oz (59.3 kg)  04/05/17 127 lb 4 oz (57.7 kg)  02/26/17 130 lb 12.8 oz (59.3 kg)   21.76 kg/m  Patient Active Problem List   Diagnosis Date Noted  . Skin lesion 05/07/2017  . Encounter for screening for lipoid disorders 02/25/2016  . Medication adverse effect 02/25/2016  . Heavy menses 02/25/2016  . Gynecological examination 01/23/2011  . Routine general medical examination at a health care facility 01/07/2011  . Bipolar disorder (HCC) 01/01/2010  . ACNE VULGARIS, FACIAL 01/01/2010  . 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 H/O abnormal cervical Papanicolaou smear 10/06/2007  . G E R D 12/14/2006  . EPISTAXIS 12/13/2006   Past Medical History:  Diagnosis Date  . Abnormal Pap smear of cervix   . Bipolar 1 disorder (HCC)   . Bronchitis   . Complication of anesthesia    during wisdom teeth extraction, experience tachycardia with anesthesia   . GERD (gastroesophageal reflux disease)   . HSV-2 infection   . Palpitations   . Stress reaction    Past Surgical History:  Procedure Laterality Date  . CESAREAN SECTION  06/09/2012   Procedure:  CESAREAN SECTION;  Surgeon: Meriel Pica, MD;  Location: [facility];  Service: Obstetrics;  Laterality: N/A;  Primary Cesarean Section Delivery Baby Boy @ [phone], Apgars  9/9  . CESAREAN SECTION N/A 06/01/2013   Procedure: CESAREAN SECTION;  Surgeon: Meriel Pica, MD;  Location: [facility];  Service: Obstetrics;  Laterality: N/A;  . KNEE SURGERY    . TONSILLECTOMY    . WISDOM TOOTH EXTRACTION     Social History   Tobacco Use  . Smoking status: Never Smoker  . Smokeless tobacco: Never Used  Substance Use Topics  . Alcohol use: Yes    Alcohol/week: 0.0 oz    Comment: occ  . Drug use: No   Family History  Problem Relation Age of Onset  . Hypertension Father   . Hyperlipidemia Father   . Leukemia Paternal Uncle   . Cancer Paternal Grandfather        prostate  . Alcohol abuse Paternal Grandfather   . Hyperlipidemia Paternal Grandfather    Allergies  Allergen Reactions  . Naproxen Diarrhea  . Ortho Tri-Cyclen [Norgestimate-Eth Estradiol] Other (See Comments)    Heavy menses  . Other     STEROIDS:  Causes upset stomach/severe cramps/mood swings  . Duricef [Cefadroxil] Hives and Rash   Current Outpatient Medications on File Prior to Visit  Medication Sig Dispense  Refill  . ipratropium (ATROVENT) 0.06 % nasal spray Place 2 sprays into both nostrils 4 (four) times daily. (Patient taking differently: Place 2 sprays into both nostrils daily as needed. ) 15 mL 0  . lamoTRIgine (LAMICTAL) 200 MG tablet Take 1 tablet (200 mg total) by mouth daily. 30 tablet 11  . valACYclovir (VALTREX) 500 MG tablet Take 1 tablet (500 mg total) by mouth daily. For 5-7 days as needed for outbreak 15 tablet 3   No current facility-administered medications on file prior to visit.      Review of Systems  Constitutional: Negative for activity change, appetite change, fatigue, fever and unexpected weight change.  HENT: Negative for congestion, ear pain, rhinorrhea, sinus pressure and sore throat.   Eyes:  Negative for pain, redness and visual disturbance.  Respiratory: Negative for cough, shortness of breath and wheezing.   Cardiovascular: Negative for chest pain and palpitations.  Gastrointestinal: Negative for abdominal pain, blood in stool, constipation and diarrhea.  Endocrine: Negative for polydipsia and polyuria.  Genitourinary: Negative for dysuria, frequency and urgency.  Musculoskeletal: Negative for arthralgias, back pain and myalgias.  Skin: Negative for pallor and rash.       Pos for mole on back that itches   Allergic/Immunologic: Negative for environmental allergies.  Neurological: Negative for dizziness, syncope and headaches.       Pos for occ involuntary head movement - slt more than a tremor   Hematological: Negative for adenopathy. Does not bruise/bleed easily.  Psychiatric/Behavioral: Negative for decreased concentration and dysphoric mood. The patient is not nervous/anxious.        Objective:   Physical Exam  Constitutional: She appears well-developed and well-nourished. No distress.  Well appearing  HENT:  Head: Normocephalic and atraumatic.  Mouth/Throat: Oropharynx is clear and moist.  Eyes: Conjunctivae and EOM are normal. Pupils are equal, round, and reactive to light.  Neck: Normal range of motion. Neck supple. No JVD present. Carotid bruit is not present. No thyromegaly present.  Cardiovascular: Normal rate, regular rhythm, normal heart sounds and intact distal pulses. Exam reveals no gallop.  Pulmonary/Chest: Effort normal and breath sounds normal. No respiratory distress. She has no wheezes. She has no rales.  No crackles  Abdominal: Soft. Bowel sounds are normal. She exhibits no distension, no abdominal bruit and no mass. There is no tenderness.  Musculoskeletal: She exhibits no edema or tenderness.  Lymphadenopathy:    She has no cervical adenopathy.  Neurological: She is alert. She has normal reflexes. She displays no tremor. No cranial nerve deficit.  She exhibits normal muscle tone. Coordination normal.  No facial or head ticks noted today  (per pt this does not happen all the time)  No tremor   Skin: Skin is warm and dry. No rash noted. No pallor.  2-3 mm raised skin colored papule on L upper back- slt shiny but not translucent Not vascular    Psychiatric: She has a normal mood and affect.          Assessment & Plan:   Problem List Items Addressed This Visit      Musculoskeletal and Integument   Skin lesion    On L upper back- likely benign but cannot r/o basal cell  Recent itching/irritaion also supports different diagnosis Ref to dermatology for eval /tx      Relevant Orders   Ambulatory referral to Dermatology     Other   Bipolar disorder (HCC) - Primary    Pt has done well on lamictal  200 for a while-now wonders if occasional "tic" of head may be a side effect (not obs today but it comes and goes)  Fairly nl exam Pt does wonder if she is actually bipolar  Will ref to psychiatry for eval and tx In the meantime-wean dose to 150 mg and then 100 mg as tolerated  Disc mood changes to watch for carefully  Will update       Relevant Orders   Ambulatory referral to Psychiatry

## 2017-05-09 NOTE — Assessment & Plan Note (Signed)
Pt has done well on lamictal 200 for a while-now wonders if occasional "tic" of head may be a side effect (not obs today but it comes and goes)  Fairly nl exam Pt does wonder if she is actually bipolar  Will ref to psychiatry for eval and tx In the meantime-wean dose to 150 mg and then 100 mg as tolerated  Disc mood changes to watch for carefully  Will update

## 2017-05-09 NOTE — Assessment & Plan Note (Signed)
On L upper back- likely benign but cannot r/o basal cell  Recent itching/irritaion also supports different diagnosis Ref to dermatology for eval /tx

## 2017-05-14 DIAGNOSIS — D2262 Melanocytic nevi of left upper limb, including shoulder: Secondary | ICD-10-CM | POA: Diagnosis not present

## 2017-05-14 DIAGNOSIS — D485 Neoplasm of uncertain behavior of skin: Secondary | ICD-10-CM | POA: Diagnosis not present

## 2017-07-13 DIAGNOSIS — F429 Obsessive-compulsive disorder, unspecified: Secondary | ICD-10-CM | POA: Diagnosis not present

## 2017-07-13 DIAGNOSIS — F4322 Adjustment disorder with anxiety: Secondary | ICD-10-CM | POA: Diagnosis not present

## 2017-07-13 DIAGNOSIS — F39 Unspecified mood [affective] disorder: Secondary | ICD-10-CM | POA: Diagnosis not present

## 2017-07-16 DIAGNOSIS — B373 Candidiasis of vulva and vagina: Secondary | ICD-10-CM | POA: Diagnosis not present

## 2017-07-16 DIAGNOSIS — J40 Bronchitis, not specified as acute or chronic: Secondary | ICD-10-CM | POA: Diagnosis not present

## 2017-07-16 DIAGNOSIS — J302 Other seasonal allergic rhinitis: Secondary | ICD-10-CM | POA: Diagnosis not present

## 2017-07-16 DIAGNOSIS — J019 Acute sinusitis, unspecified: Secondary | ICD-10-CM | POA: Diagnosis not present

## 2017-08-10 DIAGNOSIS — F4322 Adjustment disorder with anxiety: Secondary | ICD-10-CM | POA: Diagnosis not present

## 2017-08-10 DIAGNOSIS — F429 Obsessive-compulsive disorder, unspecified: Secondary | ICD-10-CM | POA: Diagnosis not present

## 2017-08-10 DIAGNOSIS — F39 Unspecified mood [affective] disorder: Secondary | ICD-10-CM | POA: Diagnosis not present

## 2017-09-06 DIAGNOSIS — F429 Obsessive-compulsive disorder, unspecified: Secondary | ICD-10-CM | POA: Diagnosis not present

## 2017-09-06 DIAGNOSIS — F39 Unspecified mood [affective] disorder: Secondary | ICD-10-CM | POA: Diagnosis not present

## 2017-09-06 DIAGNOSIS — F4322 Adjustment disorder with anxiety: Secondary | ICD-10-CM | POA: Diagnosis not present

## 2017-10-15 ENCOUNTER — Telehealth: Payer: Self-pay | Admitting: Family Medicine

## 2017-10-15 NOTE — Telephone Encounter (Signed)
Spoke to pt who states she is wanting to see if Dr tower is accepting new pts. Advised that, per front desk, she is not. Advised that our nurse practitioners are accepting new pts; transferred to the front desk

## 2017-10-15 NOTE — Telephone Encounter (Signed)
Copied from CRM [id number]. Topic: Quick Communication - See Telephone Encounter >> Oct 15, 2017 11:43 AM Lorrine KinMcGee, Marcellius Montagna B, NT wrote: CRM for notification. See Telephone encounter for: 10/15/17. Patient would like for [facility] to give her a call back. Would not go into detail of what it was concerning. Please advise. CB#: [phone]

## 2017-11-08 DIAGNOSIS — F4322 Adjustment disorder with anxiety: Secondary | ICD-10-CM | POA: Diagnosis not present

## 2017-11-08 DIAGNOSIS — F39 Unspecified mood [affective] disorder: Secondary | ICD-10-CM | POA: Diagnosis not present

## 2017-11-08 DIAGNOSIS — F429 Obsessive-compulsive disorder, unspecified: Secondary | ICD-10-CM | POA: Diagnosis not present

## 2018-02-24 ENCOUNTER — Encounter: Payer: Self-pay | Admitting: Family Medicine

## 2018-02-24 ENCOUNTER — Ambulatory Visit: Payer: BLUE CROSS/BLUE SHIELD | Admitting: Family Medicine

## 2018-02-24 VITALS — BP 106/62 | HR 82 | Temp 98.2°F | Ht 65.0 in | Wt 121.5 lb

## 2018-02-24 DIAGNOSIS — J069 Acute upper respiratory infection, unspecified: Secondary | ICD-10-CM | POA: Insufficient documentation

## 2018-02-24 DIAGNOSIS — B9789 Other viral agents as the cause of diseases classified elsewhere: Secondary | ICD-10-CM | POA: Diagnosis not present

## 2018-02-24 MED ORDER — DOXYCYCLINE HYCLATE 100 MG PO TABS: 100.0000 mg | ORAL_TABLET | Freq: Two times a day (BID) | ORAL | 0 refills | Status: DC

## 2018-02-24 MED ORDER — BENZONATATE 200 MG PO CAPS: 200.0000 mg | ORAL_CAPSULE | Freq: Three times a day (TID) | ORAL | 1 refills | Status: DC | PRN

## 2018-02-24 NOTE — Assessment & Plan Note (Addendum)
Reassuring exam  Disc symptomatic care - see instructions on AVS  Recommend nasal saline Expectorant prn  Tessalon prn for cough    Given px for doxycycline to fill if symptoms persist or worsen  Update if not starting to improve in a week or if worsening

## 2018-02-24 NOTE — Progress Notes (Signed)
Subjective:    Patient ID: Kayla Chavez, female    DOB: August 06, 1985, 32 y.o.   MRN: [id number]  HPI Here for uri symptoms   Started this past weekend with pnd  She used her nasal spray and alka selzer cold  Is progressing   ST - raw  Ears- hurt / feel full  Some sinus pain  Congestion and rhinorrhea - d/c - ? What color   Chest is tight  Cough- prod- ? Color , thick No wheezing No sob   Low grade temp   Patient Active Problem List   Diagnosis Date Noted  . Viral URI with cough 02/24/2018  . Skin lesion 05/07/2017  . Encounter for screening for lipoid disorders 02/25/2016  . Medication adverse effect 02/25/2016  . Heavy menses 02/25/2016  . Gynecological examination 01/23/2011  . Routine general medical examination at a health care facility 01/07/2011  . Bipolar disorder (HCC) 01/01/2010  . ACNE VULGARIS, FACIAL 01/01/2010  . 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 H/O abnormal cervical Papanicolaou smear 10/06/2007  . G E R D 12/14/2006  . EPISTAXIS 12/13/2006   Past Medical History:  Diagnosis Date  . Abnormal Pap smear of cervix   . Bipolar 1 disorder (HCC)   . Bronchitis   . Complication of anesthesia    during wisdom teeth extraction, experience tachycardia with anesthesia   . GERD (gastroesophageal reflux disease)   . HSV-2 infection   . Palpitations   . Stress reaction    Past Surgical History:  Procedure Laterality Date  . CESAREAN SECTION  06/09/2012   Procedure: CESAREAN SECTION;  Surgeon: Meriel Pica, MD;  Location: [facility];  Service: Obstetrics;  Laterality: N/A;  Primary Cesarean Section Delivery Baby Boy @ [phone], Apgars  9/9  . CESAREAN SECTION N/A 06/01/2013   Procedure: CESAREAN SECTION;  Surgeon: Meriel Pica, MD;  Location: [facility];  Service: Obstetrics;  Laterality: N/A;  . KNEE SURGERY    . TONSILLECTOMY    . WISDOM TOOTH EXTRACTION     Social History   Tobacco Use  . Smoking status: Never Smoker  . Smokeless tobacco: Never Used   Substance Use Topics  . Alcohol use: Yes    Alcohol/week: 0.0 standard drinks    Comment: occ  . Drug use: No   Family History  Problem Relation Age of Onset  . Hypertension Father   . Hyperlipidemia Father   . Leukemia Paternal Uncle   . Cancer Paternal Grandfather        prostate  . Alcohol abuse Paternal Grandfather   . Hyperlipidemia Paternal Grandfather    Allergies  Allergen Reactions  . Naproxen Diarrhea  . Ortho Tri-Cyclen [Norgestimate-Eth Estradiol] Other (See Comments)    Heavy menses  . Other     STEROIDS:  Causes upset stomach/severe cramps/mood swings  . Duricef [Cefadroxil] Hives and Rash   Current Outpatient Medications on File Prior to Visit  Medication Sig Dispense Refill  . ipratropium (ATROVENT) 0.06 % nasal spray Place 2 sprays into both nostrils 4 (four) times daily. (Patient taking differently: Place 2 sprays into both nostrils daily as needed. ) 15 mL 0  . lamoTRIgine (LAMICTAL) 100 MG tablet Take 1 1/2 pills by mouth once daily 45 tablet 11  . lamoTRIgine (LAMICTAL) 200 MG tablet Take 1 tablet (200 mg total) by mouth daily. 30 tablet 11  . valACYclovir (VALTREX) 500 MG tablet Take 1 tablet (500 mg total) by mouth daily. For 5-7 days as needed  for outbreak 15 tablet 3   No current facility-administered medications on file prior to visit.     Review of Systems  Constitutional: Positive for appetite change and fatigue.       Low grade temp-not fever  HENT: Positive for congestion, postnasal drip, rhinorrhea, sinus pressure, sneezing and sore throat. Negative for ear pain.   Eyes: Negative for pain and discharge.  Respiratory: Positive for cough and chest tightness. Negative for shortness of breath, wheezing and stridor.   Cardiovascular: Negative for chest pain.  Gastrointestinal: Negative for diarrhea, nausea and vomiting.  Genitourinary: Negative for frequency, hematuria and urgency.  Musculoskeletal: Negative for arthralgias and myalgias.  Skin:  Negative for rash.  Neurological: Positive for headaches. Negative for dizziness, weakness and light-headedness.  Psychiatric/Behavioral: Negative for confusion and dysphoric mood.       Objective:   Physical Exam  Constitutional: She appears well-developed and well-nourished. No distress.  Well appearing  Mildly hoarse voice   HENT:  Head: Normocephalic and atraumatic.  Right Ear: External ear normal.  Left Ear: External ear normal.  Mouth/Throat: Oropharynx is clear and moist.  Nares are injected and congested  No sinus tenderness Clear rhinorrhea and post nasal drip   Eyes: Pupils are equal, round, and reactive to light. Conjunctivae and EOM are normal. Right eye exhibits no discharge. Left eye exhibits no discharge.  Neck: Normal range of motion. Neck supple.  Cardiovascular: Normal rate and normal heart sounds.  Pulmonary/Chest: Effort normal and breath sounds normal. No stridor. No respiratory distress. She has no wheezes. She has no rales. She exhibits no tenderness.  Good air exch  Harsh bs  No rales/rhonchi  No wheeze  Lymphadenopathy:    She has no cervical adenopathy.  Neurological: She is alert.  Skin: Skin is warm and dry. No rash noted.  Psychiatric: She has a normal mood and affect.          Assessment & Plan:   Problem List Items Addressed This Visit      Respiratory   Viral URI with cough - Primary    Reassuring exam  Disc symptomatic care - see instructions on AVS  Recommend nasal saline Expectorant prn  Tessalon prn for cough    Given px for doxycycline to fill if symptoms persist or worsen  Update if not starting to improve in a week or if worsening

## 2018-02-24 NOTE — Patient Instructions (Signed)
Drink fluids Rest when you can  Nasal saline spray - helps also for congestion If your cold medicine does not have an expectorant - (guiafenesin)- the get some mucinex over the counter   Try tessalon for cough if needed   If worse or no improvement at the 10 day mark-fill doxycycline   Update if not starting to improve in a week or if worsening

## 2018-03-25 DIAGNOSIS — F39 Unspecified mood [affective] disorder: Secondary | ICD-10-CM | POA: Diagnosis not present

## 2018-03-25 DIAGNOSIS — F4322 Adjustment disorder with anxiety: Secondary | ICD-10-CM | POA: Diagnosis not present

## 2018-03-25 DIAGNOSIS — F429 Obsessive-compulsive disorder, unspecified: Secondary | ICD-10-CM | POA: Diagnosis not present

## 2018-06-28 ENCOUNTER — Ambulatory Visit (HOSPITAL_COMMUNITY)
Admission: EM | Admit: 2018-06-28 | Discharge: 2018-06-28 | Disposition: A | Discharge status: Home / Self Care | Payer: BLUE CROSS/BLUE SHIELD | Attending: Family Medicine | Admitting: Family Medicine

## 2018-06-28 ENCOUNTER — Encounter (HOSPITAL_COMMUNITY): Payer: Self-pay | Admitting: Family Medicine

## 2018-06-28 DIAGNOSIS — R69 Illness, unspecified: Secondary | ICD-10-CM

## 2018-06-28 DIAGNOSIS — J111 Influenza due to unidentified influenza virus with other respiratory manifestations: Secondary | ICD-10-CM

## 2018-06-28 MED ORDER — OSELTAMIVIR PHOSPHATE 75 MG PO CAPS: 75.0000 mg | ORAL_CAPSULE | Freq: Two times a day (BID) | ORAL | 0 refills | Status: AC

## 2018-06-28 MED ORDER — HYDROCODONE-HOMATROPINE 5-1.5 MG/5ML PO SYRP: 5.0000 mL | ORAL_SOLUTION | Freq: Four times a day (QID) | ORAL | 0 refills | Status: DC | PRN

## 2018-06-28 NOTE — Discharge Instructions (Addendum)

## 2018-06-28 NOTE — ED Triage Notes (Signed)
Pt presents with loss of voice, sore throat, and shortness of breath since earlier today.  Pt has a history of bronchitis

## 2018-06-29 NOTE — ED Provider Notes (Signed)
[facility]   [id number] 06/28/18 Arrival Time: 1930  ASSESSMENT & PLAN:  1. Influenza-like illness    See AVS for discharge instructions.  Meds ordered this encounter  Medications  . HYDROcodone-homatropine (HYCODAN) 5-1.5 MG/5ML syrup    Sig: Take 5 mLs by mouth every 6 (six) hours as needed for cough.    Dispense:  90 mL    Refill:  0  . oseltamivir (TAMIFLU) 75 MG capsule    Sig: Take 1 capsule (75 mg total) by mouth 2 (two) times daily for 5 days.    Dispense:  10 capsule    Refill:  0   Cough medication sedation precautions. Discussed typical duration of symptoms. OTC symptom care as needed. Ensure adequate fluid intake and rest. May f/u with PCP or here as needed.  Reviewed expectations re: course of current medical issues. Questions answered. Outlined signs and symptoms indicating need for more acute intervention. Patient verbalized understanding. After Visit Summary given.   SUBJECTIVE: History from: patient.  Kayla Chavez is a 33 y.o. female who presents with complaint of nasal congestion, post-nasal drainage, and a persistent dry cough; with sore throat. Onset abrupt, today; with fatigue and with body aches. SOB: none. Wheezing: none. Fever: she is unsure. Overall normal PO intake without n/v. Known sick contacts: no. No specific or significant aggravating or alleviating factors reported. OTC treatment: none reported.  Received flu shot this year: no.  Social History   Tobacco Use  Smoking Status Never Smoker  Smokeless Tobacco Never Used    ROS: As per HPI.   OBJECTIVE:  Vitals:   06/28/18 1943  BP: 121/85  Pulse: 95  Resp: 16  Temp: 98.2 F (36.8 C)  TempSrc: Oral  SpO2: 100%     General appearance: alert; appears fatigued HEENT: nasal congestion; clear runny nose; throat irritation secondary to post-nasal drainage Neck: supple without LAD CV: RRR Lungs: unlabored respirations, symmetrical air entry without wheezing;  cough: moderate Abd: soft Ext: no LE edema Skin: warm and dry Psychological: alert and cooperative; normal mood and affect  Allergies  Allergen Reactions  . Naproxen Diarrhea  . Ortho Tri-Cyclen [Norgestimate-Eth Estradiol] Other (See Comments)    Heavy menses  . Other     STEROIDS:  Causes upset stomach/severe cramps/mood swings  . Duricef [Cefadroxil] Hives and Rash    Past Medical History:  Diagnosis Date  . Abnormal Pap smear of cervix   . Bipolar 1 disorder (HCC)   . Bronchitis   . Complication of anesthesia    during wisdom teeth extraction, experience tachycardia with anesthesia   . GERD (gastroesophageal reflux disease)   . HSV-2 infection   . Palpitations   . Stress reaction    Family History  Problem Relation Age of Onset  . Hypertension Father   . Hyperlipidemia Father   . Leukemia Paternal Uncle   . Cancer Paternal Grandfather        prostate  . Alcohol abuse Paternal Grandfather   . Hyperlipidemia Paternal Grandfather    Social History   Socioeconomic History  . Marital status: Married    Spouse name: Not on file  . Number of children: Not on file  . Years of education: Not on file  . Highest education level: Not on file  Occupational History  . Not on file  Social Needs  . Financial resource strain: Not on file  . Food insecurity:    Worry: Not on file    Inability: Not on file  .  Transportation needs:    Medical: Not on file    Non-medical: Not on file  Tobacco Use  . Smoking status: Never Smoker  . Smokeless tobacco: Never Used  Substance and Sexual Activity  . Alcohol use: Yes    Alcohol/week: 0.0 standard drinks    Comment: occ  . Drug use: No  . Sexual activity: Yes  Lifestyle  . Physical activity:    Days per week: Not on file    Minutes per session: Not on file  . Stress: Not on file  Relationships  . Social connections:    Talks on phone: Not on file    Gets together: Not on file    Attends religious service: Not on file      Active member of club or organization: Not on file    Attends meetings of clubs or organizations: Not on file    Relationship status: Not on file  . Intimate partner violence:    Fear of current or ex partner: Not on file    Emotionally abused: Not on file    Physically abused: Not on file    Forced sexual activity: Not on file  Other Topics Concern  . Not on file  Social History Narrative  . Not on file           Mardella Layman, MD 06/29/18 [phone]

## 2018-06-30 DIAGNOSIS — Z806 Family history of leukemia: Secondary | ICD-10-CM | POA: Diagnosis not present

## 2018-06-30 DIAGNOSIS — Z6821 Body mass index (BMI) 21.0-21.9, adult: Secondary | ICD-10-CM | POA: Diagnosis not present

## 2018-06-30 DIAGNOSIS — Z01419 Encounter for gynecological examination (general) (routine) without abnormal findings: Secondary | ICD-10-CM | POA: Diagnosis not present

## 2018-06-30 DIAGNOSIS — Z8042 Family history of malignant neoplasm of prostate: Secondary | ICD-10-CM | POA: Diagnosis not present

## 2018-07-20 DIAGNOSIS — F4322 Adjustment disorder with anxiety: Secondary | ICD-10-CM | POA: Diagnosis not present

## 2018-07-20 DIAGNOSIS — F39 Unspecified mood [affective] disorder: Secondary | ICD-10-CM | POA: Diagnosis not present

## 2018-07-20 DIAGNOSIS — F429 Obsessive-compulsive disorder, unspecified: Secondary | ICD-10-CM | POA: Diagnosis not present

## 2018-11-14 DIAGNOSIS — F39 Unspecified mood [affective] disorder: Secondary | ICD-10-CM | POA: Diagnosis not present

## 2018-11-14 DIAGNOSIS — F429 Obsessive-compulsive disorder, unspecified: Secondary | ICD-10-CM | POA: Diagnosis not present

## 2018-11-14 DIAGNOSIS — F4322 Adjustment disorder with anxiety: Secondary | ICD-10-CM | POA: Diagnosis not present

## 2019-03-16 DIAGNOSIS — F39 Unspecified mood [affective] disorder: Secondary | ICD-10-CM | POA: Diagnosis not present

## 2019-03-16 DIAGNOSIS — F4322 Adjustment disorder with anxiety: Secondary | ICD-10-CM | POA: Diagnosis not present

## 2019-03-16 DIAGNOSIS — F429 Obsessive-compulsive disorder, unspecified: Secondary | ICD-10-CM | POA: Diagnosis not present

## 2019-05-25 DIAGNOSIS — U071 COVID-19: Secondary | ICD-10-CM | POA: Diagnosis not present

## 2019-06-13 DIAGNOSIS — U071 COVID-19: Secondary | ICD-10-CM | POA: Diagnosis not present

## 2019-06-16 ENCOUNTER — Telehealth: Payer: Self-pay

## 2019-06-16 NOTE — Telephone Encounter (Signed)
Pt notified of Dr. Tower's comments and verbalized understanding  

## 2019-06-16 NOTE — Telephone Encounter (Signed)
As long as she has not had anaphylaxis with a vaccine in the past she is ok to get it  If there is something new I am not aware of let me know

## 2019-06-16 NOTE — Telephone Encounter (Signed)
Pt left v/m wanting to know if safe for pt to get covid vaccine with her medical hx; there is a covid vaccine clinic today. Pt last seen 02/24/2018.

## 2019-06-21 DIAGNOSIS — U071 COVID-19: Secondary | ICD-10-CM | POA: Diagnosis not present

## 2019-07-11 DIAGNOSIS — U071 COVID-19: Secondary | ICD-10-CM | POA: Diagnosis not present

## 2019-07-17 ENCOUNTER — Encounter: Payer: Self-pay | Admitting: Family Medicine

## 2019-07-17 ENCOUNTER — Ambulatory Visit (INDEPENDENT_AMBULATORY_CARE_PROVIDER_SITE_OTHER): Payer: 59 | Admitting: Family Medicine

## 2019-07-17 DIAGNOSIS — F3170 Bipolar disorder, currently in remission, most recent episode unspecified: Secondary | ICD-10-CM

## 2019-07-17 DIAGNOSIS — J069 Acute upper respiratory infection, unspecified: Secondary | ICD-10-CM

## 2019-07-17 MED ORDER — PREDNISONE 10 MG PO TABS: ORAL_TABLET | ORAL | 0 refills | Status: DC

## 2019-07-17 MED ORDER — ALBUTEROL SULFATE HFA 108 (90 BASE) MCG/ACT IN AERS: 2.0000 | INHALATION_SPRAY | RESPIRATORY_TRACT | 0 refills | Status: DC | PRN

## 2019-07-17 MED ORDER — GUAIFENESIN-CODEINE 100-10 MG/5ML PO SYRP: 5.0000 mL | ORAL_SOLUTION | Freq: Three times a day (TID) | ORAL | 0 refills | Status: DC | PRN

## 2019-07-17 MED ORDER — BENZONATATE 200 MG PO CAPS: 200.0000 mg | ORAL_CAPSULE | Freq: Three times a day (TID) | ORAL | 0 refills | Status: DC | PRN

## 2019-07-17 NOTE — Patient Instructions (Addendum)
Go home for now  Let us know when your covid test returns  Drink fluids and rest Take tessalon for cough  Try the codeine cough medicine at night with caution of sedation  Take the prednisone taper for wheezing Use albuterol mdi as needed  Motrin or tylenol for elevated temperature   Update if not starting to improve in a week or if worsening   Go to ER if suddenly severe  Let us know also if any new symptoms

## 2019-07-17 NOTE — Assessment & Plan Note (Signed)
No clinical changes 

## 2019-07-17 NOTE — Assessment & Plan Note (Signed)
New symptoms from the weekend Has had her covid vaccine  covid test from this am is pending  Going home/work note faxed  rec fluids and rest  Motrin or tylenol for temp  Prednisone for wheezing along with albuterol prn  Tessalon and robitussin ac for cough prn   Meds ordered this encounter  Medications  . DISCONTD: guaiFENesin-codeine (ROBITUSSIN AC) 100-10 MG/5ML syrup    Sig: Take 5 mLs by mouth 3 (three) times daily as needed for cough.    Dispense:  80 mL    Refill:  0  . benzonatate (TESSALON) 200 MG capsule    Sig: Take 1 capsule (200 mg total) by mouth 3 (three) times daily as needed for cough. Do not bite pill    Dispense:  30 capsule    Refill:  0  . predniSONE (DELTASONE) 10 MG tablet    Sig: Take 3 pills once daily by mouth for 3 days, then 2 pills once daily for 3 days, then 1 pill once daily for 3 days and then stop    Dispense:  18 tablet    Refill:  0  . albuterol (VENTOLIN HFA) 108 (90 Base) MCG/ACT inhaler    Sig: Inhale 2 puffs into the lungs every 4 (four) hours as needed for wheezing or shortness of breath.    Dispense:  18 g    Refill:  0  . guaiFENesin-codeine (ROBITUSSIN AC) 100-10 MG/5ML syrup    Sig: Take 5 mLs by mouth 3 (three) times daily as needed for cough.    Dispense:  80 mL    Refill:  0   inst to call us with covid test result Update if not starting to improve in a week or if worsening

## 2019-07-17 NOTE — Progress Notes (Signed)
Virtual Visit via Video Note  I connected with Kayla Chavez on 07/17/19 at  2:00 PM EDT by a video enabled telemedicine application and verified that I am speaking with the correct person using two identifiers.  Location: Patient: work Provider: office   I discussed the limitations of evaluation and management by telemedicine and the availability of in person appointments. The patient expressed understanding and agreed to proceed.  Parties involved in encounter  Patient: Kayla Chavez  Provider:  Roxy Manns MD    History of Present Illness: Pt presents with uri symptoms and fever   Last week thought she had allergies  Hydroxyzine helped at night   Had 2nd covid vaccine Friday   (moderna)  Felt lousy the next day    Cough -prod of sputum- ? Color  A nurse at work listened to her chest - raspy Purulent nasal drainage with blood  Facial pressure   Some wheezing   Low grade temp  At its highest 99.7 No n/v no diarrhea  No appetite   Can still smell and taste   OTC Zyrtec  Vitamins  No cough medicine  Motrin   Tested Monday -for covid - negative  Got tested this am - usually comes back in 24 hours    Patient Active Problem List   Diagnosis Date Noted  . Viral URI with cough 02/24/2018  . Skin lesion 05/07/2017  . Encounter for screening for lipoid disorders 02/25/2016  . Medication adverse effect 02/25/2016  . Heavy menses 02/25/2016  . Gynecological examination 01/23/2011  . Routine general medical examination at a health care facility 01/07/2011  . Bipolar disorder (HCC) 01/01/2010  . ACNE VULGARIS, FACIAL 01/01/2010  . 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 H/O abnormal cervical Papanicolaou smear 10/06/2007  . G E R D 12/14/2006  . EPISTAXIS 12/13/2006   Past Medical History:  Diagnosis Date  . Abnormal Pap smear of cervix   . Bipolar 1 disorder (HCC)   . Bronchitis   . Complication of anesthesia    during wisdom teeth extraction, experience  tachycardia with anesthesia   . GERD (gastroesophageal reflux disease)   . HSV-2 infection   . Palpitations   . Stress reaction    Past Surgical History:  Procedure Laterality Date  . CESAREAN SECTION  06/09/2012   Procedure: CESAREAN SECTION;  Surgeon: Meriel Pica, MD;  Location: [facility];  Service: Obstetrics;  Laterality: N/A;  Primary Cesarean Section Delivery Baby Boy @ [phone], Apgars  9/9  . CESAREAN SECTION N/A 06/01/2013   Procedure: CESAREAN SECTION;  Surgeon: Meriel Pica, MD;  Location: [facility];  Service: Obstetrics;  Laterality: N/A;  . KNEE SURGERY    . TONSILLECTOMY    . WISDOM TOOTH EXTRACTION     Social History   Tobacco Use  . Smoking status: Never Smoker  . Smokeless tobacco: Never Used  Substance Use Topics  . Alcohol use: Yes    Alcohol/week: 0.0 standard drinks    Comment: occ  . Drug use: No   Family History  Problem Relation Age of Onset  . Hypertension Father   . Hyperlipidemia Father   . Leukemia Paternal Uncle   . Cancer Paternal Grandfather        prostate  . Alcohol abuse Paternal Grandfather   . Hyperlipidemia Paternal Grandfather    Allergies  Allergen Reactions  . Naproxen Diarrhea  . Ortho Tri-Cyclen [Norgestimate-Eth Estradiol] Other (See Comments)    Heavy menses  . Other  STEROIDS:  Causes upset stomach/severe cramps/mood swings  . Duricef [Cefadroxil] Hives and Rash   Current Outpatient Medications on File Prior to Visit  Medication Sig Dispense Refill  . ipratropium (ATROVENT) 0.06 % nasal spray Place 2 sprays into both nostrils 4 (four) times daily. (Patient taking differently: Place 2 sprays into both nostrils daily as needed. ) 15 mL 0  . lamoTRIgine (LAMICTAL) 100 MG tablet Take 1 1/2 pills by mouth once daily 45 tablet 11  . lamoTRIgine (LAMICTAL) 200 MG tablet Take 1 tablet (200 mg total) by mouth daily. 30 tablet 11  . valACYclovir (VALTREX) 500 MG tablet Take 1 tablet (500 mg total) by mouth daily. For 5-7 days as  needed for outbreak 15 tablet 3   No current facility-administered medications on file prior to visit.   Review of Systems  Constitutional: Positive for fever and malaise/fatigue. Negative for chills and diaphoresis.  HENT: Positive for congestion and sinus pain. Negative for ear pain and sore throat.   Eyes: Negative for blurred vision, discharge and redness.  Respiratory: Positive for cough, sputum production and wheezing. Negative for shortness of breath and stridor.   Cardiovascular: Negative for chest pain, palpitations and leg swelling.  Gastrointestinal: Negative for abdominal pain, diarrhea, nausea and vomiting.  Musculoskeletal: Negative for myalgias.  Skin: Negative for rash.  Neurological: Positive for headaches. Negative for dizziness.    Observations/Objective: Patient appears well, in no distress but fatigued  Weight is baseline  No facial swelling or asymmetry Slightly hoarse voice /also sounds nasally congested  No obvious tremor or mobility impairment Moving neck and UEs normally Able to hear the call well  occ dry sounding cough/ no wheezing noted during interview  Talkative and mentally sharp with no cognitive changes No skin changes on face or neck , no rash or pallor Affect is normal    Assessment and Plan: Problem List Items Addressed This Visit      Respiratory   Viral URI with cough    New symptoms from the weekend Has had her covid vaccine  covid test from this am is pending  Going home/work note faxed  rec fluids and rest  Motrin or tylenol for temp  Prednisone for wheezing along with albuterol prn  Tessalon and robitussin ac for cough prn   Meds ordered this encounter  Medications  . DISCONTD: guaiFENesin-codeine (ROBITUSSIN AC) 100-10 MG/5ML syrup    Sig: Take 5 mLs by mouth 3 (three) times daily as needed for cough.    Dispense:  80 mL    Refill:  0  . benzonatate (TESSALON) 200 MG capsule    Sig: Take 1 capsule (200 mg total) by mouth 3  (three) times daily as needed for cough. Do not bite pill    Dispense:  30 capsule    Refill:  0  . predniSONE (DELTASONE) 10 MG tablet    Sig: Take 3 pills once daily by mouth for 3 days, then 2 pills once daily for 3 days, then 1 pill once daily for 3 days and then stop    Dispense:  18 tablet    Refill:  0  . albuterol (VENTOLIN HFA) 108 (90 Base) MCG/ACT inhaler    Sig: Inhale 2 puffs into the lungs every 4 (four) hours as needed for wheezing or shortness of breath.    Dispense:  18 g    Refill:  0  . guaiFENesin-codeine (ROBITUSSIN AC) 100-10 MG/5ML syrup    Sig: Take 5 mLs by mouth  3 (three) times daily as needed for cough.    Dispense:  80 mL    Refill:  0   inst to call us with covid test result Update if not starting to improve in a week or if worsening            Follow Up Instructions: Go home for now  Let us know when your covid test returns  Drink fluids and rest Take tessalon for cough  Try the codeine cough medicine at night with caution of sedation  Take the prednisone taper for wheezing Use albuterol mdi as needed  Motrin or tylenol for elevated temperature   Update if not starting to improve in a week or if worsening   Go to ER if suddenly severe  Let us know also if any new symptoms    I discussed the assessment and treatment plan with the patient. The patient was provided an opportunity to ask questions and all were answered. The patient agreed with the plan and demonstrated an understanding of the instructions.   The patient was advised to call back or seek an in-person evaluation if the symptoms worsen or if the condition fails to improve as anticipated.     Loura Pardon, MD

## 2019-07-18 DIAGNOSIS — U071 COVID-19: Secondary | ICD-10-CM | POA: Diagnosis not present

## 2019-07-19 ENCOUNTER — Ambulatory Visit
Admission: EM | Admit: 2019-07-19 | Discharge: 2019-07-19 | Disposition: A | Discharge status: Home / Self Care | Payer: 59 | Attending: Emergency Medicine | Admitting: Emergency Medicine

## 2019-07-19 ENCOUNTER — Ambulatory Visit (INDEPENDENT_AMBULATORY_CARE_PROVIDER_SITE_OTHER): Payer: 59

## 2019-07-19 ENCOUNTER — Ambulatory Visit: Payer: 59

## 2019-07-19 ENCOUNTER — Other Ambulatory Visit: Payer: Self-pay

## 2019-07-19 ENCOUNTER — Telehealth: Payer: Self-pay | Admitting: *Deleted

## 2019-07-19 DIAGNOSIS — J3089 Other allergic rhinitis: Secondary | ICD-10-CM

## 2019-07-19 DIAGNOSIS — R05 Cough: Secondary | ICD-10-CM | POA: Diagnosis not present

## 2019-07-19 DIAGNOSIS — R059 Cough, unspecified: Secondary | ICD-10-CM

## 2019-07-19 DIAGNOSIS — J302 Other seasonal allergic rhinitis: Secondary | ICD-10-CM | POA: Diagnosis not present

## 2019-07-19 MED ORDER — CETIRIZINE HCL 10 MG PO TABS: 10.0000 mg | ORAL_TABLET | Freq: Every day | ORAL | 0 refills | Status: DC

## 2019-07-19 MED ORDER — PROMETHAZINE-DM 6.25-15 MG/5ML PO SYRP: 5.0000 mL | ORAL_SOLUTION | Freq: Four times a day (QID) | ORAL | 0 refills | Status: DC | PRN

## 2019-07-19 MED ORDER — FLUTICASONE PROPIONATE 50 MCG/ACT NA SUSP: 1.0000 | Freq: Every day | NASAL | 0 refills | Status: DC

## 2019-07-19 NOTE — ED Triage Notes (Signed)
Pt c/o cough and chest congestion since Monday. States had a virtual visit with her PCP on Monday and tx'd. States her chest congestion and tightness has gotten worse. Her PCP sent her here for a chest x-ray. States had a neg. covid test on Monday. States had her 2nd covid vaccine last Friday.

## 2019-07-19 NOTE — Telephone Encounter (Signed)
Aware and agree with that advisement.   I will watch for progress notes

## 2019-07-19 NOTE — Telephone Encounter (Signed)
Patient called stating that she had a negative covid test done at work on Monday. Patient stated that she still has a cough and some SOB. Patient stated that she feels worse today.  Patient stated that she is on day two of Prednisone and it kept her up for 24 hours. Patient stated that she had her second covid vaccine Friday and had body aches after the vaccine. After speaking to Dr. Milinda Antis patient was scheduled for the respiratory clinic tonight at 6:15. Patient was given ER precautions. Patient stated that she does not think that she wants to wait that long to be seen. Information was given to patient for [facility] at [facility]. Patient stated that she may call them and go there if she can get in sooner. Patient was advised to call back and cancel the appointment for the respiratory clinic tonight if she decides to go the the Urgent Care earlier.

## 2019-07-19 NOTE — Telephone Encounter (Signed)
FYI Patient called back and cancelled her appointment tonight at the respiratory clinic because she was seen at urgent care.

## 2019-07-19 NOTE — Discharge Instructions (Signed)

## 2019-07-19 NOTE — ED Provider Notes (Signed)
[facility]    CSN: [id number] Arrival date & time: 07/19/19  1021      History   Chief Complaint Chief Complaint  Patient presents with  . Cough    HPI Kayla Chavez is a 34 y.o. female with history of BPD type I, GERD presenting for chest x-ray.  Patient states that she has had cough and chest congestion since Monday.  Underwent Covid testing on Monday: Negative.  States that she received her second Covid vaccine the preceding Friday: States she felt lousy the next day.  Patient had a virtual visit with her PCP on Monday: Treated for viral URI with benzonatate, prednisone, Robitussin.  Patient states that she is not able to tolerate prednisone as it kept her up "for 24 hours straight".  Patient denying fever, chest pain, palpitations, shortness of breath, lower extremity edema.   Past Medical History:  Diagnosis Date  . Abnormal Pap smear of cervix   . Bipolar 1 disorder ([place])   . Bronchitis   . Complication of anesthesia    during wisdom teeth extraction, experience tachycardia with anesthesia   . GERD (gastroesophageal reflux disease)   . HSV-2 infection   . Palpitations   . Stress reaction     Patient Active Problem List   Diagnosis Date Noted  . Viral URI with cough 02/24/2018  . Skin lesion 05/07/2017  . Encounter for screening for lipoid disorders 02/25/2016  . Medication adverse effect 02/25/2016  . Heavy menses 02/25/2016  . Gynecological examination 01/23/2011  . Routine general medical examination at a health care facility 01/07/2011  . Bipolar disorder ([place]) 01/01/2010  . ACNE VULGARIS, FACIAL 01/01/2010  . 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 H/O abnormal cervical Papanicolaou smear 10/06/2007  . G E R D 12/14/2006  . EPISTAXIS 12/13/2006    Past Surgical History:  Procedure Laterality Date  . CESAREAN SECTION  06/09/2012   Procedure: CESAREAN SECTION;  Surgeon: Margarette Asal, MD;  Location: [facility];  Service: Obstetrics;  Laterality:  N/A;  Primary Cesarean Section Delivery Baby Boy @ [phone], Apgars  9/9  . CESAREAN SECTION N/A 06/01/2013   Procedure: CESAREAN SECTION;  Surgeon: Margarette Asal, MD;  Location: [facility];  Service: Obstetrics;  Laterality: N/A;  . KNEE SURGERY    . TONSILLECTOMY    . WISDOM TOOTH EXTRACTION      OB History    Gravida  2   Para  2   Term  2   Preterm  0   AB  0   Living  2     SAB  0   TAB  0   Ectopic  0   Multiple  0   Live Births  2            Home Medications    Prior to Admission medications   Medication Sig Start Date End Date Taking? Authorizing Provider  predniSONE (DELTASONE) 10 MG tablet Take 3 pills once daily by mouth for 3 days, then 2 pills once daily for 3 days, then 1 pill once daily for 3 days and then stop 07/17/19  Yes Tower, Wynelle Fanny, MD  albuterol (VENTOLIN HFA) 108 (90 Base) MCG/ACT inhaler Inhale 2 puffs into the lungs every 4 (four) hours as needed for wheezing or shortness of breath. 07/17/19   Tower, Wynelle Fanny, MD  benzonatate (TESSALON) 200 MG capsule Take 1 capsule (200 mg total) by mouth 3 (three) times daily as needed for cough. Do not  bite pill 07/17/19   Tower, Audrie Gallus, MD  cetirizine (ZYRTEC ALLERGY) 10 MG tablet Take 1 tablet (10 mg total) by mouth daily. 07/19/19   Hall-Potvin, [place], PA-C  fluticasone (FLONASE) 50 MCG/ACT nasal spray Place 1 spray into both nostrils daily. 07/19/19   Hall-Potvin, [place], PA-C  guaiFENesin-codeine (ROBITUSSIN AC) 100-10 MG/5ML syrup Take 5 mLs by mouth 3 (three) times daily as needed for cough. 07/17/19   Tower, Audrie Gallus, MD  ipratropium (ATROVENT) 0.06 % nasal spray Place 2 sprays into both nostrils 4 (four) times daily. Patient taking differently: Place 2 sprays into both nostrils daily as needed.  02/26/17   Ardith Dark, MD  lamoTRIgine (LAMICTAL) 200 MG tablet Take 1 tablet (200 mg total) by mouth daily. 04/05/17   Tower, Audrie Gallus, MD  promethazine-dextromethorphan (PROMETHAZINE-DM) 6.25-15 MG/5ML  syrup Take 5 mLs by mouth 4 (four) times daily as needed for cough. 07/19/19   Hall-Potvin, [place], PA-C  valACYclovir (VALTREX) 500 MG tablet Take 1 tablet (500 mg total) by mouth daily. For 5-7 days as needed for outbreak 02/25/16   Tower, Audrie Gallus, MD    Family History Family History  Problem Relation Age of Onset  . Hypertension Father   . Hyperlipidemia Father   . Leukemia Paternal Uncle   . Cancer Paternal Grandfather        prostate  . Alcohol abuse Paternal Grandfather   . Hyperlipidemia Paternal Grandfather     Social History Social History   Tobacco Use  . Smoking status: Never Smoker  . Smokeless tobacco: Never Used  Substance Use Topics  . Alcohol use: Yes    Alcohol/week: 0.0 standard drinks    Comment: occ  . Drug use: No     Allergies   Naproxen, Ortho tri-cyclen [norgestimate-eth estradiol], Other, and Duricef [cefadroxil]   Review of Systems As per HPI   Physical Exam Triage Vital Signs ED Triage Vitals  Enc Vitals Group     BP      Pulse      Resp      Temp      Temp src      SpO2      Weight      Height      Head Circumference      Peak Flow      Pain Score      Pain Loc      Pain Edu?      Excl. in GC?    No data found.  Updated Vital Signs BP 117/78 (BP Location: Left Arm)   Pulse (!) 102   Temp 99.4 F (37.4 C) (Oral)   Resp 16   LMP 07/06/2019   SpO2 97%   Visual Acuity Right Eye Distance:   Left Eye Distance:   Bilateral Distance:    Right Eye Near:   Left Eye Near:    Bilateral Near:     Physical Exam Constitutional:      General: She is not in acute distress.    Appearance: She is normal weight. She is not ill-appearing or diaphoretic.  HENT:     Head: Normocephalic and atraumatic.     Mouth/Throat:     Mouth: Mucous membranes are moist.     Pharynx: Oropharynx is clear. No oropharyngeal exudate or posterior oropharyngeal erythema.  Eyes:     General: No scleral icterus.    Conjunctiva/sclera:  Conjunctivae normal.     Pupils: Pupils are equal, round, and reactive to light.  Neck:     Comments: Trachea midline, negative JVD Cardiovascular:     Rate and Rhythm: Normal rate and regular rhythm.     Heart sounds: No murmur. No gallop.   Pulmonary:     Effort: Pulmonary effort is normal. No respiratory distress.     Breath sounds: No wheezing, rhonchi or rales.  Musculoskeletal:     Cervical back: Neck supple. No tenderness.  Lymphadenopathy:     Cervical: No cervical adenopathy.  Skin:    Capillary Refill: Capillary refill takes less than 2 seconds.     Coloration: Skin is not jaundiced or pale.     Findings: No rash.  Neurological:     General: No focal deficit present.     Mental Status: She is alert and oriented to person, place, and time.      UC Treatments / Results  Labs (all labs ordered are listed, but only abnormal results are displayed) Labs Reviewed - No data to display  EKG   Radiology DG Chest 2 View  Result Date: 07/19/2019 CLINICAL DATA:  Cough, congestion EXAM: CHEST - 2 VIEW COMPARISON:  10/26/2012 FINDINGS: The heart size and mediastinal contours are within normal limits. No focal airspace consolidation, pleural effusion, or pneumothorax. The visualized skeletal structures are unremarkable. IMPRESSION: No active cardiopulmonary disease. Electronically Signed   By: Duanne Guess D.O.   On: 07/19/2019 11:16    Procedures Procedures (including critical care time)  Medications Ordered in UC Medications - No data to display  Initial Impression / Assessment and Plan / UC Course  I have reviewed the triage vital signs and the nursing notes.  Pertinent labs & imaging results that were available during my care of the patient were reviewed by me and considered in my medical decision making (see chart for details).     Patient afebrile, nontoxic in office today.  Chest x-ray done in office, reviewed by me and radiology compared to previous from  10/26/2012: No focal airspace consolidation, pleural effusion, or pneumothorax.  Reviewed findings with patient verbalized understanding.  Will treat supportively as outlined below and have patient follow up with PCP.  Return precautions discussed, patient verbalized understanding and is agreeable to plan. Final Clinical Impressions(s) / UC Diagnoses   Final diagnoses:  Seasonal allergic rhinitis due to other allergic trigger  Cough     Discharge Instructions     Tessalon for cough. Start flonase, atrovent nasal spray for nasal congestion/drainage. You can use over the counter nasal saline rinse such as neti pot for nasal congestion. Keep hydrated, your urine should be clear to pale yellow in color. Tylenol/motrin for fever and pain. Monitor for any worsening of symptoms, chest pain, shortness of breath, wheezing, swelling of the throat, go to the emergency department for further evaluation needed.     ED Prescriptions    Medication Sig Dispense Auth. Provider   cetirizine (ZYRTEC ALLERGY) 10 MG tablet Take 1 tablet (10 mg total) by mouth daily. 30 tablet Hall-Potvin, [place], PA-C   fluticasone (FLONASE) 50 MCG/ACT nasal spray Place 1 spray into both nostrils daily. 16 g Hall-Potvin, [place], PA-C   promethazine-dextromethorphan (PROMETHAZINE-DM) 6.25-15 MG/5ML syrup Take 5 mLs by mouth 4 (four) times daily as needed for cough. 118 mL Hall-Potvin, [place], PA-C     PDMP not reviewed this encounter.   Hall-Potvin, [place], [place] 07/19/19 1735

## 2019-07-26 DIAGNOSIS — U071 COVID-19: Secondary | ICD-10-CM | POA: Diagnosis not present

## 2019-08-01 DIAGNOSIS — U071 COVID-19: Secondary | ICD-10-CM | POA: Diagnosis not present

## 2019-08-09 ENCOUNTER — Other Ambulatory Visit: Payer: Self-pay | Admitting: Family Medicine

## 2019-08-15 DIAGNOSIS — U071 COVID-19: Secondary | ICD-10-CM | POA: Diagnosis not present

## 2019-09-06 DIAGNOSIS — U071 COVID-19: Secondary | ICD-10-CM | POA: Diagnosis not present

## 2019-10-25 ENCOUNTER — Encounter: Payer: Self-pay | Admitting: Primary Care

## 2019-10-25 ENCOUNTER — Ambulatory Visit: Payer: 59 | Admitting: Primary Care

## 2019-10-25 ENCOUNTER — Other Ambulatory Visit: Payer: Self-pay

## 2019-10-25 VITALS — BP 106/72 | HR 84 | Temp 97.0°F | Ht 65.0 in | Wt 130.8 lb

## 2019-10-25 DIAGNOSIS — H1033 Unspecified acute conjunctivitis, bilateral: Secondary | ICD-10-CM | POA: Diagnosis not present

## 2019-10-25 MED ORDER — CIPROFLOXACIN HCL 0.3 % OP SOLN: 1.0000 [drp] | Freq: Four times a day (QID) | OPHTHALMIC | 0 refills | Status: DC

## 2019-10-25 NOTE — Patient Instructions (Signed)
Instill 1 drop into each eye four times daily for five to seven days. Continue for an additional two days after symptoms resolve.   It was a pleasure to see you today!

## 2019-10-25 NOTE — Progress Notes (Signed)
Subjective:    Patient ID: Kayla Chavez, female    DOB: 11/30/1985, 34 y.o.   MRN: [id number]  HPI  This visit occurred during the SARS-CoV-2 public health emergency.  Safety protocols were in place, including screening questions prior to the visit, additional usage of staff PPE, and extensive cleaning of exam room while observing appropriate contact time as indicated for disinfecting solutions.   Kayla Chavez is a 34 year old female patient of Dr. Milinda Antis with a history of GERD, bipolar disorder, epistaxis who presents today with a chief complaint of eye irritation.  Her symptoms began yesterday with a sensation of foreign body to the right eye. Later she noticed whitish mucous discharge, burning sensation. This morning she noticed her eye was matted shut, also noted symptoms to the left eye. She continues to notice burning with whitish, sticky discharge.   She denies changes in make up, food, medication changes. She denies cough, postnasal drip, visual changes. 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   Review of Systems  Constitutional: Negative for fever.  HENT: Negative for congestion, postnasal drip and sneezing.   Eyes: Positive for pain, discharge and redness. Negative for visual disturbance.  Respiratory: Negative for cough.        Past Medical History:  Diagnosis Date  . Abnormal Pap smear of cervix   . Bipolar 1 disorder ([facility])   . Bronchitis   . Complication of anesthesia    during wisdom teeth extraction, experience tachycardia with anesthesia   . GERD (gastroesophageal reflux disease)   . HSV-2 infection   . Palpitations   . Stress reaction      Social History   Socioeconomic History  . Marital status: Married    Spouse name: Not on file  . Number of children: Not on file  . Years of education: Not on file  . Highest education level: Not on file  Occupational History  . Not on file  Tobacco Use  . Smoking status: Never Smoker  . Smokeless tobacco: Never  Used  Vaping Use  . Vaping Use: Never used  Substance and Sexual Activity  . Alcohol use: Yes    Alcohol/week: 0.0 standard drinks    Comment: occ  . Drug use: No  . Sexual activity: Yes  Other Topics Concern  . Not on file  Social History Narrative  . Not on file   Social Determinants of Health   Financial Resource Strain:   . Difficulty of Paying Living Expenses:   Food Insecurity:   . Worried About Programme researcher, broadcasting/film/video in the Last Year:   . Barista in the Last Year:   Transportation Needs:   . Freight forwarder (Medical):   Marland Kitchen Lack of Transportation (Non-Medical):   Physical Activity:   . Days of Exercise per Week:   . Minutes of Exercise per Session:   Stress:   . Feeling of Stress :   Social Connections:   . Frequency of Communication with Friends and Family:   . Frequency of Social Gatherings with Friends and Family:   . Attends Religious Services:   . Active Member of Clubs or Organizations:   . Attends Banker Meetings:   Marland Kitchen Marital Status:   Intimate Partner Violence:   . Fear of Current or Ex-Partner:   . Emotionally Abused:   Marland Kitchen Physically Abused:   . Sexually Abused:     Past Surgical History:  Procedure Laterality Date  . CESAREAN  SECTION  06/09/2012   Procedure: CESAREAN SECTION;  Surgeon: Margarette Asal, MD;  Location: [facility];  Service: Obstetrics;  Laterality: N/A;  Primary Cesarean Section Delivery Baby Boy @ [phone], Apgars  9/9  . CESAREAN SECTION N/A 06/01/2013   Procedure: CESAREAN SECTION;  Surgeon: Margarette Asal, MD;  Location: [facility];  Service: Obstetrics;  Laterality: N/A;  . KNEE SURGERY    . TONSILLECTOMY    . WISDOM TOOTH EXTRACTION      Family History  Problem Relation Age of Onset  . Hypertension Father   . Hyperlipidemia Father   . Leukemia Paternal Uncle   . Cancer Paternal Grandfather        prostate  . Alcohol abuse Paternal Grandfather   . Hyperlipidemia Paternal Grandfather     Allergies    Allergen Reactions  . Naproxen Diarrhea  . Ortho Tri-Cyclen [Norgestimate-Eth Estradiol] Other (See Comments)    Heavy menses  . Other     STEROIDS:  Causes upset stomach/severe cramps/mood swings  . Duricef [Cefadroxil] Hives and Rash    Current Outpatient Medications on File Prior to Visit  Medication Sig Dispense Refill  . benzonatate (TESSALON) 200 MG capsule Take 1 capsule (200 mg total) by mouth 3 (three) times daily as needed for cough. Do not bite pill 30 capsule 0  . cetirizine (ZYRTEC ALLERGY) 10 MG tablet Take 1 tablet (10 mg total) by mouth daily. 30 tablet 0  . fluticasone (FLONASE) 50 MCG/ACT nasal spray Place 1 spray into both nostrils daily. 16 g 0  . ipratropium (ATROVENT) 0.06 % nasal spray Place 2 sprays into both nostrils 4 (four) times daily. (Patient taking differently: Place 2 sprays into both nostrils daily as needed. ) 15 mL 0  . lamoTRIgine (LAMICTAL) 200 MG tablet Take 1 tablet (200 mg total) by mouth daily. 30 tablet 11  . predniSONE (DELTASONE) 10 MG tablet Take 3 pills once daily by mouth for 3 days, then 2 pills once daily for 3 days, then 1 pill once daily for 3 days and then stop 18 tablet 0  . valACYclovir (VALTREX) 500 MG tablet Take 1 tablet (500 mg total) by mouth daily. For 5-7 days as needed for outbreak 15 tablet 3  . VENTOLIN HFA 108 (90 Base) MCG/ACT inhaler INHALE 2 PUFFS INTO THE LUNGS EVERY 4 (FOUR) HOURS AS NEEDED FOR WHEEZING OR SHORTNESS OF BREATH. 18 g 1   No current facility-administered medications on file prior to visit.    BP 106/72   Pulse 84   Temp (!) 97 F (36.1 C) (Temporal)   Ht 5\' 5"  (1.651 m)   Wt 130 lb 12 oz (59.3 kg)   LMP 10/20/2019   SpO2 99%   BMI 21.76 kg/m    Objective:   Physical Exam  HENT:  Nose: Nose normal.  Mouth/Throat: Mucous membranes are moist.  Eyes: Right eye exhibits exudate. Left eye exhibits no exudate. Right conjunctiva is injected. Left conjunctiva is not injected.  Neurological: She is  alert.  Skin: Skin is warm and dry.           Assessment & Plan:

## 2019-10-25 NOTE — Assessment & Plan Note (Addendum)
Acute for the last 24+ hours. Unclear whether this is viral or bacterial, could still be allergy but she lacks other allergy symptoms.  Given discomfort, coupled with discharge, we will treat empirically with Cipro gtts. She is not a contact lens wearer.   Discussed to continue drops for an additional two days after symptoms resolve. She will update if no improvement.

## 2020-07-31 ENCOUNTER — Telehealth (INDEPENDENT_AMBULATORY_CARE_PROVIDER_SITE_OTHER): Payer: BC Managed Care – PPO | Admitting: Physician Assistant

## 2020-07-31 DIAGNOSIS — J301 Allergic rhinitis due to pollen: Secondary | ICD-10-CM

## 2020-07-31 DIAGNOSIS — R059 Cough, unspecified: Secondary | ICD-10-CM

## 2020-07-31 MED ORDER — BENZONATATE 200 MG PO CAPS: 200.0000 mg | ORAL_CAPSULE | Freq: Three times a day (TID) | ORAL | 0 refills | Status: DC | PRN

## 2020-07-31 MED ORDER — IPRATROPIUM BROMIDE 0.06 % NA SOLN: 2.0000 | Freq: Every day | NASAL | 0 refills | Status: DC | PRN

## 2020-07-31 MED ORDER — GUAIFENESIN-CODEINE 100-10 MG/5ML PO SYRP: 5.0000 mL | ORAL_SOLUTION | Freq: Every evening | ORAL | 0 refills | Status: DC

## 2020-07-31 MED ORDER — ALBUTEROL SULFATE HFA 108 (90 BASE) MCG/ACT IN AERS: 2.0000 | INHALATION_SPRAY | RESPIRATORY_TRACT | 1 refills | Status: AC | PRN

## 2020-07-31 NOTE — Patient Instructions (Signed)
Take medications as directed. Call back if worse or no improvement.  

## 2020-07-31 NOTE — Progress Notes (Signed)
Virtual Visit via Video Note  I connected with Kayla Chavez on 07/31/20 at  4:00 PM EDT by a video enabled telemedicine application and verified that I am speaking with the correct person using two identifiers.  Location: Patient: home Provider: Nature conservation officer at [employer]  I discussed the limitations of evaluation and management by telemedicine and the availability of in person appointments. The patient expressed understanding and agreed to proceed.   Only the patient and myself were present for today's video call.   History of Present Illness: Chief complaint: Cough, sneezing, congestion Symptom onset: Approx 1 week ago Pertinent positives: ST Pertinent negatives: Fever, chills, body aches, loss of taste and smell Treatments tried: Zyrtec Sick exposure: None  COVID-19 test at work was negative today.  States she has these symptoms every year when pollen flares up.     Observations/Objective: Congested sounding  Gen: Awake, alert, no acute distress Resp: Breathing is even and non-labored Psych: calm/pleasant demeanor Neuro: Alert and Oriented x 3, + facial symmetry, speech is clear.   Assessment and Plan: 1. Seasonal allergic rhinitis due to pollen 2. Cough Typical for pt this time of year. Highly doubt other viral / bacterial etiology at this time. Will treat with supportive care and tessalon perles, Atrovent, rescue inhaler, and cough syrup for bedtime use. Side effects discussed.  Follow Up Instructions:    I discussed the assessment and treatment plan with the patient. The patient was provided an opportunity to ask questions and all were answered. The patient agreed with the plan and demonstrated an understanding of the instructions.   The patient was advised to call back or seek an in-person evaluation if the symptoms worsen or if the condition fails to improve as anticipated.  Quantrell Splitt M Lamonda Noxon, PA-C

## 2020-08-29 DIAGNOSIS — L309 Dermatitis, unspecified: Secondary | ICD-10-CM | POA: Diagnosis not present

## 2020-10-03 DIAGNOSIS — L309 Dermatitis, unspecified: Secondary | ICD-10-CM | POA: Diagnosis not present

## 2020-12-09 ENCOUNTER — Other Ambulatory Visit: Payer: Self-pay

## 2020-12-09 ENCOUNTER — Telehealth (INDEPENDENT_AMBULATORY_CARE_PROVIDER_SITE_OTHER): Payer: BC Managed Care – PPO | Admitting: Family Medicine

## 2020-12-09 ENCOUNTER — Encounter: Payer: Self-pay | Admitting: Family Medicine

## 2020-12-09 VITALS — Temp 101.4°F | Ht 65.0 in | Wt 130.0 lb

## 2020-12-09 DIAGNOSIS — U071 COVID-19: Secondary | ICD-10-CM | POA: Diagnosis not present

## 2020-12-09 MED ORDER — GUAIFENESIN-CODEINE 100-10 MG/5ML PO SYRP: 5.0000 mL | ORAL_SOLUTION | Freq: Every evening | ORAL | 0 refills | Status: AC

## 2020-12-09 MED ORDER — BENZONATATE 200 MG PO CAPS: 200.0000 mg | ORAL_CAPSULE | Freq: Three times a day (TID) | ORAL | 0 refills | Status: AC | PRN

## 2020-12-09 MED ORDER — MOLNUPIRAVIR EUA 200MG CAPSULE: 4.0000 | ORAL_CAPSULE | Freq: Two times a day (BID) | ORAL | 0 refills | Status: AC

## 2020-12-09 NOTE — Progress Notes (Signed)
I connected with [name] on 12/09/20 at 12:00 PM EDT by video and verified that I am speaking with the correct person using two identifiers.   I discussed the limitations, risks, security and privacy concerns of performing an evaluation and management service by video and the availability of in person appointments. I also discussed with the patient that there may be a patient responsible charge related to this service. The patient expressed understanding and agreed to proceed.  Patient location: Home Provider Location: [place] Participants: Lynnda Child and Adelene Amas   Subjective:     Kayla Chavez is a 35 y.o. female presenting for Covid Positive, Cough, Sore Throat, Fever, Chills, and Nasal Congestion     Cough Associated symptoms include chills, a fever and myalgias. Pertinent negatives include no shortness of breath.  Sore Throat  Associated symptoms include congestion and coughing. Pertinent negatives include no diarrhea, shortness of breath or vomiting.  Fever  Associated symptoms include congestion, coughing and nausea (saturday, improved now). Pertinent negatives include no diarrhea or vomiting.   #Covid - 12/07/2020 - woke up feeling terrible - has been taking tessalon perles and codiene cough syrup - alternating tylenol and ibuprofen with fever spiking between - drinking not eating much - is keeping things down  - congestion is moving ok    Review of Systems  Constitutional:  Positive for chills and fever.  HENT:  Positive for congestion.   Respiratory:  Positive for cough. Negative for shortness of breath.   Gastrointestinal:  Positive for nausea (saturday, improved now). Negative for diarrhea and vomiting.  Musculoskeletal:  Positive for myalgias.    Social History   Tobacco Use  Smoking Status Never  Smokeless Tobacco Never        Objective:   BP Readings from Last 3 Encounters:  10/25/19 106/72  07/19/19 117/78   07/17/19 121/74   Wt Readings from Last 3 Encounters:  12/09/20 130 lb (59 kg)  10/25/19 130 lb 12 oz (59.3 kg)  02/24/18 121 lb 8 oz (55.1 kg)    Temp (!) 101.4 F (38.6 C) (Oral)   Ht 5\' 5"  (1.651 m)   Wt 130 lb (59 kg)   BMI 21.63 kg/m   Physical Exam Constitutional:      Appearance: Normal appearance. She is not ill-appearing.  HENT:     Head: Normocephalic and atraumatic.     Right Ear: External ear normal.     Left Ear: External ear normal.  Eyes:     Conjunctiva/sclera: Conjunctivae normal.  Pulmonary:     Effort: Pulmonary effort is normal. No respiratory distress.  Neurological:     Mental Status: She is alert. Mental status is at baseline.  Psychiatric:        Mood and Affect: Mood normal.        Behavior: Behavior normal.        Thought Content: Thought content normal.        Judgment: Judgment normal.           Assessment & Plan:   Problem List Items Addressed This Visit   None Visit Diagnoses     COVID-19 virus infection    -  Primary   Relevant Medications   benzonatate (TESSALON) 200 MG capsule   guaiFENesin-codeine (ROBITUSSIN AC) 100-10 MG/5ML syrup   molnupiravir EUA 200 mg CAPS      Pt on day 3 of persistent fevers. Overall low risk for severe covid but  discussed with persistent fevers that if no improvement or worsening tomorrow will send molnupiravir to have if no change in symptoms.   No recent liver/kidney function for paxlovid  ER precautions Isolation guidelines   Return if symptoms worsen or fail to improve.  Lynnda Child, MD

## 2020-12-13 ENCOUNTER — Telehealth: Payer: Self-pay | Admitting: Family Medicine

## 2020-12-13 NOTE — Telephone Encounter (Signed)
Patient notified as instructed by telephone and verbalized understanding. Patient was given ER precautions and she verbalized understanding.  

## 2020-12-13 NOTE — Telephone Encounter (Signed)
Hold the medication  Antihistamine if itchy  Update [place] next week If any s/s of severe reaction -swelling in throat/sob go to ER Thanks for the heads up

## 2020-12-13 NOTE — Telephone Encounter (Signed)
PLEASE NOTE: All timestamps contained within this report are represented as [place] Standard Time. CONFIDENTIALTY NOTICE: This fax transmission is intended only for the addressee. It contains information that is legally privileged, confidential or otherwise protected from use or disclosure. If you are not the intended recipient, you are strictly prohibited from reviewing, disclosing, copying using or disseminating any of this information or taking any action in reliance on or regarding this information. If you have received this fax in error, please notify us immediately by telephone so that we can arrange for its return to [place]. Phone: [phone], Toll-Free: [phone], Fax: [phone] Page: 1 of 2 Call Id: [id number] [facility] [place] Day - Client TELEPHONE ADVICE RECORD [facility] Patient Name: [facility] Gender: Female DOB: 11/21/1985 Age: 35 Y 1 M 4 D Return Phone Number: [phone] (Primary), [phone] (Secondary) Address: City/ State/ Zip: [place]  [address] Client [facility] Day - Client Client Site [facility] - Day Physician Milinda Antis, Idamae Schuller - MD Contact Type Call Who Is Calling Patient / Member / Family / Caregiver Call Type Triage / Clinical Relationship To Patient Self Return Phone Number [phone] (Primary) Chief Complaint Rash - Widespread Reason for Call Symptomatic / Request for Health Information Initial Comment Caller is taking anti-viral covid medication. Now she has rash on back and shoulders Translation No Nurse Assessment Nurse: Kizzie Bane, RN, [name] Date/Time (Eastern Time): 12/13/2020 12:28:51 PM Confirm and document reason for call. If symptomatic, describe symptoms. ---Caller states she has a rash on her back that looks like tiny red bumps on her back and shoulders. No fever Does the patient have any new or worsening symptoms? ---Yes Will a triage be completed?  ---Yes Related visit to physician within the last 2 weeks? ---No Does the PT have any chronic conditions? (i.e. diabetes, asthma, this includes High risk factors for pregnancy, etc.) ---No Is the patient pregnant or possibly pregnant? (Ask all females between the ages of 63-55) ---No Is this a behavioral health or substance abuse call? ---No Guidelines Guideline Title Affirmed Question Affirmed Notes Nurse Date/Time (Eastern Time) Rash - Widespread On Drugs Hives or itching Leanord Hawking 12/13/2020 12:30:14 PM Disp. Time Lamount Cohen Time) Disposition Final User 12/13/2020 12:33:41 PM See PCP within 24 Hours Yes Kizzie Bane, RN, [name] PLEASE NOTE: All timestamps contained within this report are represented as [place] Standard Time. CONFIDENTIALTY NOTICE: This fax transmission is intended only for the addressee. It contains information that is legally privileged, confidential or otherwise protected from use or disclosure. If you are not the intended recipient, you are strictly prohibited from reviewing, disclosing, copying using or disseminating any of this information or taking any action in reliance on or regarding this information. If you have received this fax in error, please notify us immediately by telephone so that we can arrange for its return to [place]. Phone: [phone], Toll-Free: [phone], Fax: [phone] Page: 2 of 2 Call Id: [id number] Caller Disagree/Comply Comply Caller Understands Yes PreDisposition Did not know what to do Care Advice Given Per Guideline SEE PCP WITHIN 24 HOURS: STOP THE MEDICINE: ANTIHISTAMINE MEDICINES FOR HIVES OR FOR SEVERE ITCHING: CALL BACK IF: * You become worse CARE ADVICE given per Rash - Widespread on Drugs (Adult) guideline Referrals REFERRED TO PCP OFFICE

## 2020-12-13 NOTE — Telephone Encounter (Signed)
Mrs. Kayla Chavez called in and stated that she has a rash on her shoulders and back from the covid medication. And wanted to know about what to do.

## 2020-12-23 DIAGNOSIS — L309 Dermatitis, unspecified: Secondary | ICD-10-CM | POA: Diagnosis not present

## 2021-01-13 DIAGNOSIS — N390 Urinary tract infection, site not specified: Secondary | ICD-10-CM | POA: Diagnosis not present

## 2021-01-13 DIAGNOSIS — A6009 Herpesviral infection of other urogenital tract: Secondary | ICD-10-CM | POA: Diagnosis not present

## 2021-01-24 IMAGING — DX DG CHEST 2V
2 series · 2 of 2 positions shown · non-contrast
Comparison: 10/26/2012

CLINICAL DATA: Cough, congestion

EXAM:
CHEST - 2 VIEW

[chest pa]
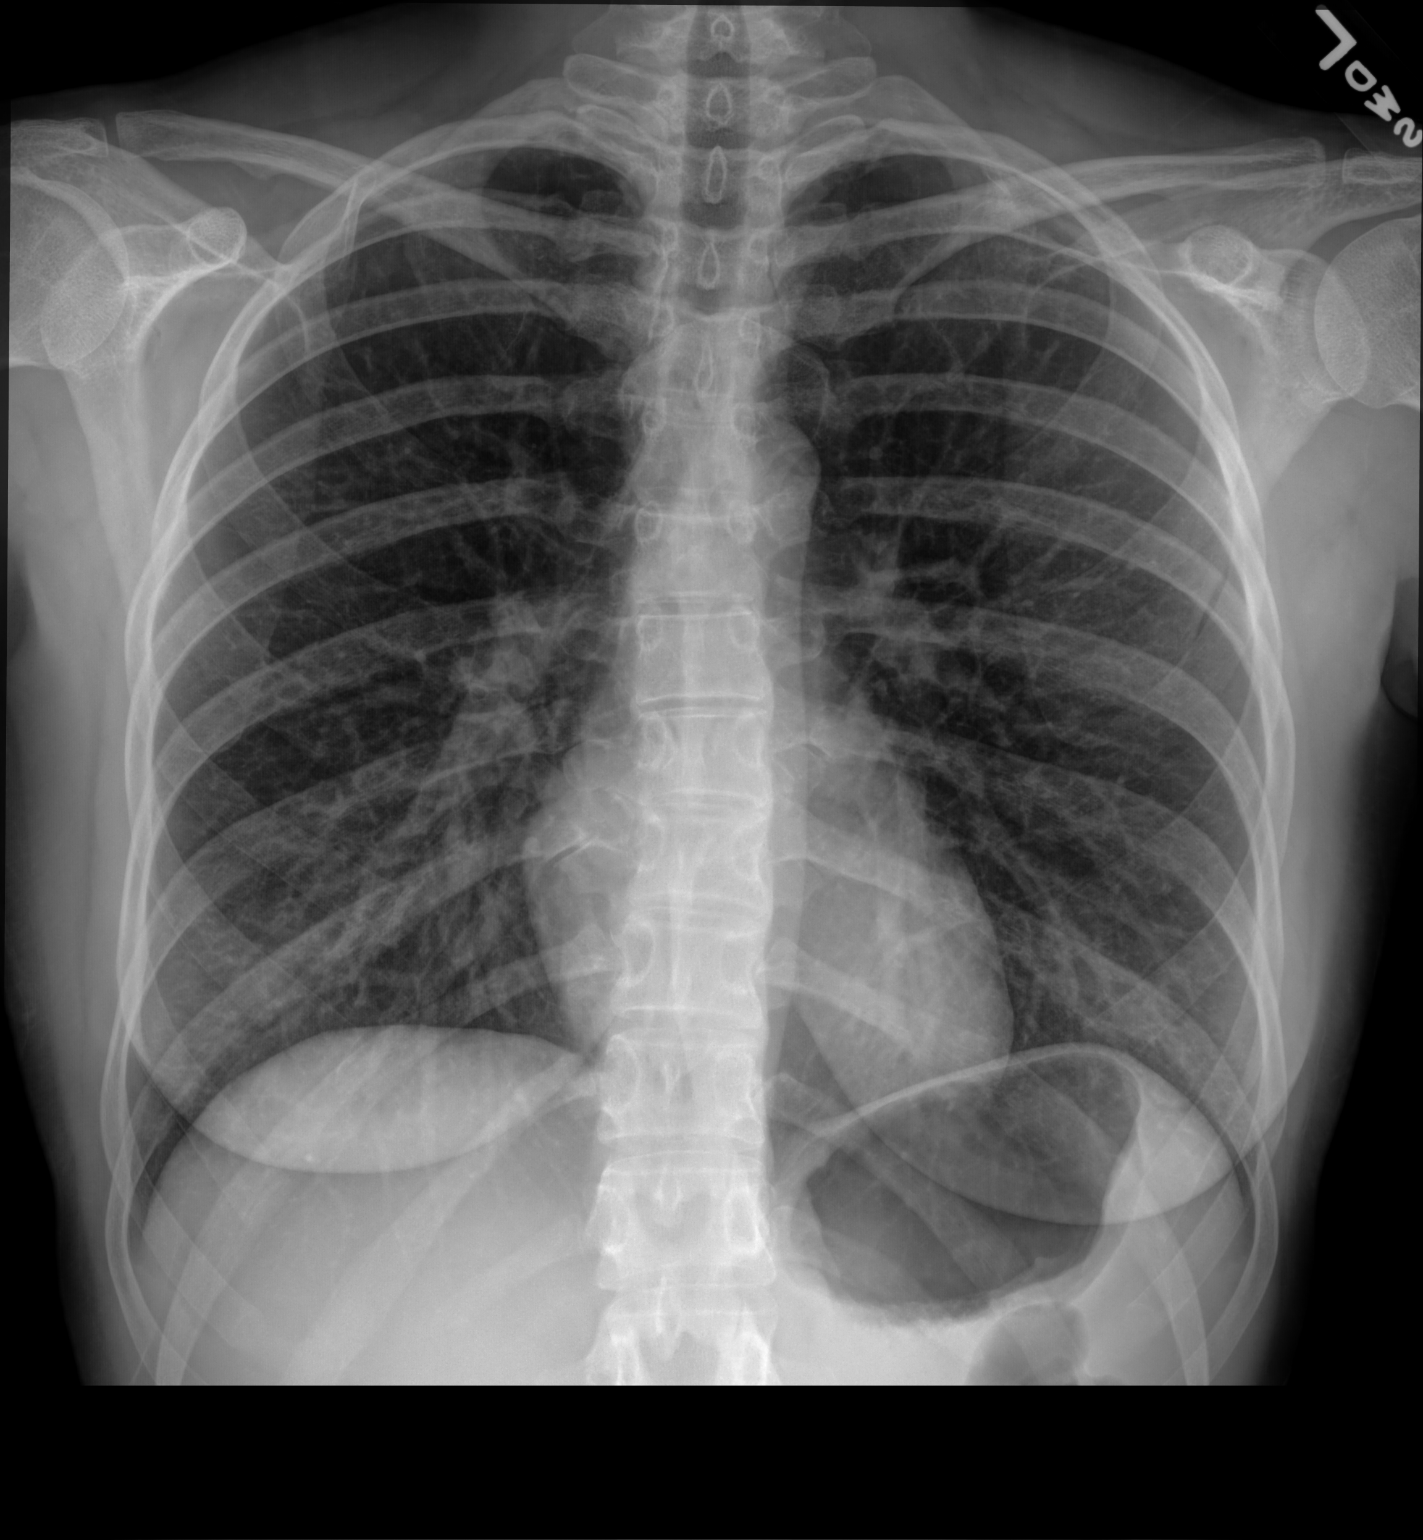

[chest lat]
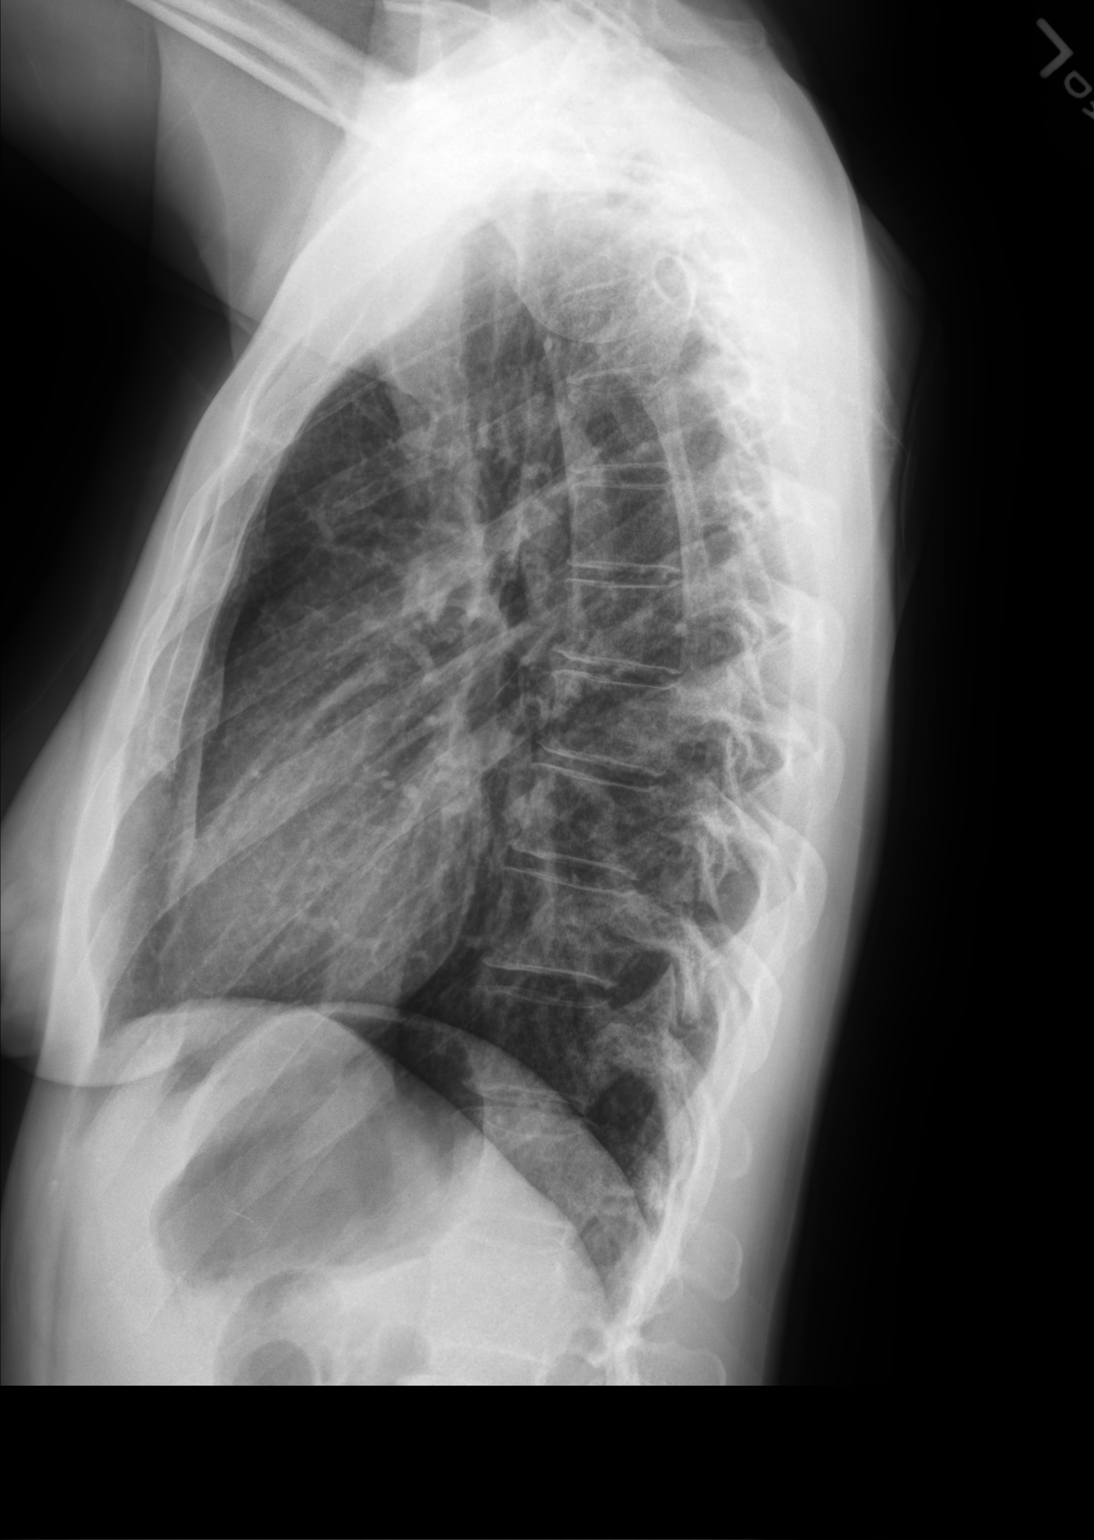

[2 of 2 positions shown; findings below may reference images not displayed]

FINDINGS: The heart size and mediastinal contours are within normal limits. No
focal airspace consolidation, pleural effusion, or pneumothorax. The
visualized skeletal structures are unremarkable.
IMPRESSION: No active cardiopulmonary disease.

## 2021-03-21 ENCOUNTER — Telehealth: Payer: Self-pay | Admitting: Family Medicine

## 2021-03-21 MED ORDER — VALACYCLOVIR HCL 500 MG PO TABS: 500.0000 mg | ORAL_TABLET | Freq: Every day | ORAL | 3 refills | Status: AC

## 2021-03-21 NOTE — Telephone Encounter (Signed)
Pt called in wants to know if she can have RX Valtrex be call in . Would like a call back . Medication is no longer on pt med. List . Please advise # [phone]

## 2021-03-21 NOTE — Telephone Encounter (Signed)
Looking at past meds valtrex was last prescribed in 2017. Pt had a recent virtual sick visit with another provider but hasn't seen PCP for a f/u or CPE in over a year and no future appts. Please advise

## 2021-03-21 NOTE — Telephone Encounter (Signed)
Called patient and lvmtcb to schedule CPE

## 2021-03-21 NOTE — Telephone Encounter (Signed)
I sent it  Please schedule PE in the winter or spring

## 2021-03-24 NOTE — Telephone Encounter (Signed)
2nd attempt  LMTCB to schedule  

## 2021-05-27 DIAGNOSIS — R0981 Nasal congestion: Secondary | ICD-10-CM | POA: Diagnosis not present

## 2021-05-27 DIAGNOSIS — J029 Acute pharyngitis, unspecified: Secondary | ICD-10-CM | POA: Diagnosis not present

## 2021-05-27 DIAGNOSIS — Z20822 Contact with and (suspected) exposure to covid-19: Secondary | ICD-10-CM | POA: Diagnosis not present

## 2021-05-27 DIAGNOSIS — R059 Cough, unspecified: Secondary | ICD-10-CM | POA: Diagnosis not present

## 2021-06-13 DIAGNOSIS — J069 Acute upper respiratory infection, unspecified: Secondary | ICD-10-CM | POA: Diagnosis not present

## 2022-12-23 ENCOUNTER — Telehealth: Payer: Self-pay | Admitting: Family Medicine

## 2022-12-23 NOTE — Telephone Encounter (Signed)
Called patient to schedule next available cpe, no answer left Vm.

## 2023-11-16 DIAGNOSIS — Z13220 Encounter for screening for lipoid disorders: Secondary | ICD-10-CM | POA: Diagnosis not present

## 2023-11-16 DIAGNOSIS — M25561 Pain in right knee: Secondary | ICD-10-CM | POA: Diagnosis not present

## 2023-11-16 DIAGNOSIS — Z0000 Encounter for general adult medical examination without abnormal findings: Secondary | ICD-10-CM | POA: Diagnosis not present

## 2023-11-16 DIAGNOSIS — Z1329 Encounter for screening for other suspected endocrine disorder: Secondary | ICD-10-CM | POA: Diagnosis not present

## 2023-11-16 DIAGNOSIS — M25562 Pain in left knee: Secondary | ICD-10-CM | POA: Diagnosis not present

## 2023-11-16 DIAGNOSIS — Z7689 Persons encountering health services in other specified circumstances: Secondary | ICD-10-CM | POA: Diagnosis not present

## 2023-11-16 DIAGNOSIS — Z131 Encounter for screening for diabetes mellitus: Secondary | ICD-10-CM | POA: Diagnosis not present

## 2023-11-16 DIAGNOSIS — Z23 Encounter for immunization: Secondary | ICD-10-CM | POA: Diagnosis not present
